# Patient Record
Sex: Male | Born: 1985 | Race: White | Hispanic: No | Marital: Married | Smoking: Former smoker
Health system: Southern US, Community
[De-identification: ages and names within clinical notes are randomized; demographics above are authoritative.]

## PROBLEM LIST (undated history)

## (undated) DIAGNOSIS — M109 Gout, unspecified: Secondary | ICD-10-CM

## (undated) DIAGNOSIS — I10 Essential (primary) hypertension: Secondary | ICD-10-CM

## (undated) DIAGNOSIS — E559 Vitamin D deficiency, unspecified: Secondary | ICD-10-CM

## (undated) DIAGNOSIS — E785 Hyperlipidemia, unspecified: Secondary | ICD-10-CM

## (undated) DIAGNOSIS — E291 Testicular hypofunction: Secondary | ICD-10-CM

## (undated) DIAGNOSIS — R7303 Prediabetes: Secondary | ICD-10-CM

## (undated) DIAGNOSIS — M25512 Pain in left shoulder: Secondary | ICD-10-CM

## (undated) DIAGNOSIS — G4733 Obstructive sleep apnea (adult) (pediatric): Secondary | ICD-10-CM

## (undated) HISTORY — DX: Pain in left shoulder: M25.512

## (undated) HISTORY — DX: Prediabetes: R73.03

## (undated) HISTORY — DX: Gout, unspecified: M10.9

## (undated) HISTORY — PX: FRACTURE SURGERY: SHX138

## (undated) HISTORY — DX: Essential (primary) hypertension: I10

## (undated) HISTORY — DX: Hyperlipidemia, unspecified: E78.5

## (undated) HISTORY — DX: Vitamin D deficiency, unspecified: E55.9

## (undated) HISTORY — DX: Obstructive sleep apnea (adult) (pediatric): G47.33

## (undated) HISTORY — PX: WISDOM TOOTH EXTRACTION: SHX21

## (undated) HISTORY — PX: FEMUR FRACTURE SURGERY: SHX633

## (undated) HISTORY — DX: Testicular hypofunction: E29.1

---
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
## 2009-03-16 HISTORY — PX: FOOT SURGERY: SHX648

## 2009-10-22 ENCOUNTER — Emergency Department (HOSPITAL_COMMUNITY): Admission: EM | Admit: 2009-10-22 | Discharge: 2009-10-22 | Payer: Self-pay | Admitting: Emergency Medicine

## 2009-11-01 ENCOUNTER — Emergency Department (HOSPITAL_COMMUNITY): Admission: EM | Admit: 2009-11-01 | Discharge: 2009-11-01 | Payer: Self-pay | Admitting: Emergency Medicine

## 2010-02-07 ENCOUNTER — Inpatient Hospital Stay (HOSPITAL_COMMUNITY)
Admission: EM | Admit: 2010-02-07 | Discharge: 2010-02-15 | Disposition: A | Discharge status: Rehabilitation Facility | Payer: Self-pay | Source: Home / Self Care | Admitting: Emergency Medicine

## 2010-02-15 ENCOUNTER — Inpatient Hospital Stay (HOSPITAL_COMMUNITY)
Admission: RE | Admit: 2010-02-15 | Discharge: 2010-02-22 | Payer: Self-pay | Attending: Physical Medicine & Rehabilitation | Admitting: Physical Medicine & Rehabilitation

## 2010-02-15 ENCOUNTER — Inpatient Hospital Stay: Admission: EM | Admit: 2010-02-15 | Payer: Self-pay | Admitting: Physical Medicine & Rehabilitation

## 2010-03-25 ENCOUNTER — Encounter (HOSPITAL_COMMUNITY)
Admission: RE | Admit: 2010-03-25 | Discharge: 2010-04-15 | Payer: Self-pay | Source: Home / Self Care | Attending: Orthopedic Surgery | Admitting: Orthopedic Surgery

## 2010-04-18 ENCOUNTER — Ambulatory Visit (HOSPITAL_COMMUNITY): Payer: Self-pay

## 2010-04-23 ENCOUNTER — Ambulatory Visit (HOSPITAL_COMMUNITY)
Admission: RE | Admit: 2010-04-23 | Discharge: 2010-04-23 | Disposition: A | Discharge status: Home / Self Care | Payer: No Typology Code available for payment source | Source: Ambulatory Visit | Attending: Orthopedic Surgery | Admitting: Orthopedic Surgery

## 2010-04-23 DIAGNOSIS — R269 Unspecified abnormalities of gait and mobility: Secondary | ICD-10-CM | POA: Insufficient documentation

## 2010-04-23 DIAGNOSIS — M25559 Pain in unspecified hip: Secondary | ICD-10-CM | POA: Insufficient documentation

## 2010-04-23 DIAGNOSIS — R262 Difficulty in walking, not elsewhere classified: Secondary | ICD-10-CM | POA: Insufficient documentation

## 2010-04-23 DIAGNOSIS — M25659 Stiffness of unspecified hip, not elsewhere classified: Secondary | ICD-10-CM | POA: Insufficient documentation

## 2010-04-23 DIAGNOSIS — IMO0001 Reserved for inherently not codable concepts without codable children: Secondary | ICD-10-CM | POA: Insufficient documentation

## 2010-04-23 DIAGNOSIS — M79609 Pain in unspecified limb: Secondary | ICD-10-CM | POA: Insufficient documentation

## 2010-04-25 ENCOUNTER — Ambulatory Visit (HOSPITAL_COMMUNITY): Payer: Self-pay

## 2010-04-30 ENCOUNTER — Ambulatory Visit (HOSPITAL_COMMUNITY)
Admission: RE | Admit: 2010-04-30 | Discharge: 2010-04-30 | Disposition: A | Discharge status: Home / Self Care | Payer: No Typology Code available for payment source | Source: Ambulatory Visit | Attending: Orthopedic Surgery | Admitting: Orthopedic Surgery

## 2010-04-30 DIAGNOSIS — M25559 Pain in unspecified hip: Secondary | ICD-10-CM | POA: Insufficient documentation

## 2010-04-30 DIAGNOSIS — R262 Difficulty in walking, not elsewhere classified: Secondary | ICD-10-CM | POA: Insufficient documentation

## 2010-04-30 DIAGNOSIS — M25673 Stiffness of unspecified ankle, not elsewhere classified: Secondary | ICD-10-CM | POA: Insufficient documentation

## 2010-04-30 DIAGNOSIS — IMO0001 Reserved for inherently not codable concepts without codable children: Secondary | ICD-10-CM | POA: Insufficient documentation

## 2010-04-30 DIAGNOSIS — M25676 Stiffness of unspecified foot, not elsewhere classified: Secondary | ICD-10-CM | POA: Insufficient documentation

## 2010-04-30 DIAGNOSIS — M6281 Muscle weakness (generalized): Secondary | ICD-10-CM | POA: Insufficient documentation

## 2010-04-30 DIAGNOSIS — M25659 Stiffness of unspecified hip, not elsewhere classified: Secondary | ICD-10-CM | POA: Insufficient documentation

## 2010-05-02 ENCOUNTER — Ambulatory Visit (HOSPITAL_COMMUNITY)
Admission: RE | Admit: 2010-05-02 | Discharge: 2010-05-02 | Disposition: A | Discharge status: Home / Self Care | Payer: No Typology Code available for payment source | Source: Ambulatory Visit

## 2010-05-07 ENCOUNTER — Ambulatory Visit (HOSPITAL_COMMUNITY)
Admission: RE | Admit: 2010-05-07 | Discharge: 2010-05-07 | Disposition: A | Discharge status: Home / Self Care | Payer: No Typology Code available for payment source | Source: Ambulatory Visit

## 2010-05-09 ENCOUNTER — Ambulatory Visit (HOSPITAL_COMMUNITY): Payer: No Typology Code available for payment source

## 2010-05-13 ENCOUNTER — Ambulatory Visit (HOSPITAL_COMMUNITY)
Admission: RE | Admit: 2010-05-13 | Discharge: 2010-05-13 | Disposition: A | Discharge status: Home / Self Care | Payer: No Typology Code available for payment source | Source: Ambulatory Visit | Attending: *Deleted | Admitting: *Deleted

## 2010-05-15 ENCOUNTER — Ambulatory Visit (HOSPITAL_COMMUNITY)
Admission: RE | Admit: 2010-05-15 | Discharge: 2010-05-15 | Disposition: A | Discharge status: Home / Self Care | Payer: No Typology Code available for payment source | Source: Ambulatory Visit | Attending: Orthopedic Surgery | Admitting: Orthopedic Surgery

## 2010-05-15 DIAGNOSIS — M25659 Stiffness of unspecified hip, not elsewhere classified: Secondary | ICD-10-CM | POA: Insufficient documentation

## 2010-05-15 DIAGNOSIS — M25673 Stiffness of unspecified ankle, not elsewhere classified: Secondary | ICD-10-CM | POA: Insufficient documentation

## 2010-05-15 DIAGNOSIS — R262 Difficulty in walking, not elsewhere classified: Secondary | ICD-10-CM | POA: Insufficient documentation

## 2010-05-15 DIAGNOSIS — M25559 Pain in unspecified hip: Secondary | ICD-10-CM | POA: Insufficient documentation

## 2010-05-15 DIAGNOSIS — M25676 Stiffness of unspecified foot, not elsewhere classified: Secondary | ICD-10-CM | POA: Insufficient documentation

## 2010-05-15 DIAGNOSIS — IMO0001 Reserved for inherently not codable concepts without codable children: Secondary | ICD-10-CM | POA: Insufficient documentation

## 2010-05-15 DIAGNOSIS — M6281 Muscle weakness (generalized): Secondary | ICD-10-CM | POA: Insufficient documentation

## 2010-05-20 ENCOUNTER — Ambulatory Visit (HOSPITAL_COMMUNITY)
Admission: RE | Admit: 2010-05-20 | Discharge: 2010-05-20 | Disposition: A | Discharge status: Home / Self Care | Payer: No Typology Code available for payment source | Source: Ambulatory Visit

## 2010-05-22 ENCOUNTER — Ambulatory Visit (HOSPITAL_COMMUNITY)
Admission: RE | Admit: 2010-05-22 | Discharge: 2010-05-22 | Disposition: A | Discharge status: Home / Self Care | Payer: No Typology Code available for payment source | Source: Ambulatory Visit

## 2010-05-27 ENCOUNTER — Ambulatory Visit (HOSPITAL_COMMUNITY)
Admission: RE | Admit: 2010-05-27 | Discharge: 2010-05-27 | Disposition: A | Discharge status: Home / Self Care | Payer: No Typology Code available for payment source | Source: Ambulatory Visit

## 2010-05-27 LAB — BASIC METABOLIC PANEL
BUN: 10 mg/dL (ref 6–23)
BUN: 6 mg/dL (ref 6–23)
BUN: 8 mg/dL (ref 6–23)
CO2: 28 mEq/L (ref 19–32)
CO2: 31 mEq/L (ref 19–32)
Chloride: 100 mEq/L (ref 96–112)
Chloride: 102 mEq/L (ref 96–112)
Chloride: 95 mEq/L — ABNORMAL LOW (ref 96–112)
Chloride: 95 mEq/L — ABNORMAL LOW (ref 96–112)
Chloride: 96 mEq/L (ref 96–112)
Chloride: 98 mEq/L (ref 96–112)
Creatinine, Ser: 0.85 mg/dL (ref 0.4–1.5)
Creatinine, Ser: 0.98 mg/dL (ref 0.4–1.5)
GFR calc Af Amer: >60 mL/min (ref >60)
GFR calc Af Amer: >60 mL/min (ref >60)
GFR calc Af Amer: >60 mL/min (ref >60)
GFR calc non Af Amer: >60 mL/min (ref >60)
GFR calc non Af Amer: >60 mL/min (ref >60)
GFR calc non Af Amer: >60 mL/min (ref >60)
Glucose, Bld: 101 mg/dL — ABNORMAL HIGH (ref 70–99)
Glucose, Bld: 89 mg/dL (ref 70–99)
Glucose, Bld: 89 mg/dL (ref 70–99)
Potassium: 3.7 mEq/L (ref 3.5–5.1)
Potassium: 3.9 mEq/L (ref 3.5–5.1)
Potassium: 4 mEq/L (ref 3.5–5.1)
Potassium: 4.1 mEq/L (ref 3.5–5.1)
Potassium: 4.1 mEq/L (ref 3.5–5.1)
Potassium: 4.2 mEq/L (ref 3.5–5.1)
Sodium: 130 mEq/L — ABNORMAL LOW (ref 135–145)
Sodium: 133 mEq/L — ABNORMAL LOW (ref 135–145)

## 2010-05-27 LAB — CBC
HCT: 24.9 % — ABNORMAL LOW (ref 39.0–52.0)
HCT: 25.2 % — ABNORMAL LOW (ref 39.0–52.0)
HCT: 27.1 % — ABNORMAL LOW (ref 39.0–52.0)
HCT: 29.7 % — ABNORMAL LOW (ref 39.0–52.0)
HCT: 32.5 % — ABNORMAL LOW (ref 39.0–52.0)
HCT: 35.4 % — ABNORMAL LOW (ref 39.0–52.0)
HCT: 42.5 % (ref 39.0–52.0)
Hemoglobin: 10.5 g/dL — ABNORMAL LOW (ref 13.0–17.0)
Hemoglobin: 11.5 g/dL — ABNORMAL LOW (ref 13.0–17.0)
Hemoglobin: 15.3 g/dL (ref 13.0–17.0)
Hemoglobin: 8.8 g/dL — ABNORMAL LOW (ref 13.0–17.0)
MCH: 30.3 pg (ref 26.0–34.0)
MCHC: 34.7 g/dL (ref 30.0–36.0)
MCHC: 35.1 g/dL (ref 30.0–36.0)
MCHC: 36 g/dL (ref 30.0–36.0)
MCV: 84.3 fL (ref 78.0–100.0)
MCV: 85 fL (ref 78.0–100.0)
MCV: 85.6 fL (ref 78.0–100.0)
MCV: 85.8 fL (ref 78.0–100.0)
MCV: 86.2 fL (ref 78.0–100.0)
MCV: 87 fL (ref 78.0–100.0)
Platelets: 215 10*3/uL (ref 150–400)
Platelets: 216 10*3/uL (ref 150–400)
Platelets: 227 10*3/uL (ref 150–400)
Platelets: 259 10*3/uL (ref 150–400)
Platelets: 558 10*3/uL — ABNORMAL HIGH (ref 150–400)
RBC: 2.93 MIL/uL — ABNORMAL LOW (ref 4.22–5.81)
RBC: 3.47 MIL/uL — ABNORMAL LOW (ref 4.22–5.81)
RBC: 4.07 MIL/uL — ABNORMAL LOW (ref 4.22–5.81)
RDW: 12 % (ref 11.5–15.5)
RDW: 12.1 % (ref 11.5–15.5)
RDW: 12.3 % (ref 11.5–15.5)
RDW: 12.3 % (ref 11.5–15.5)
WBC: 10.6 10*3/uL — ABNORMAL HIGH (ref 4.0–10.5)
WBC: 8.9 10*3/uL (ref 4.0–10.5)
WBC: 9.1 10*3/uL (ref 4.0–10.5)
WBC: 9.2 10*3/uL (ref 4.0–10.5)
WBC: 9.6 10*3/uL (ref 4.0–10.5)

## 2010-05-27 LAB — COMPREHENSIVE METABOLIC PANEL
ALT: 116 U/L — ABNORMAL HIGH (ref 0–53)
ALT: 80 U/L — ABNORMAL HIGH (ref 0–53)
AST: 76 U/L — ABNORMAL HIGH (ref 0–37)
Albumin: 2.4 g/dL — ABNORMAL LOW (ref 3.5–5.2)
Alkaline Phosphatase: 70 U/L (ref 39–117)
CO2: 27 mEq/L (ref 19–32)
Calcium: 8.5 mg/dL (ref 8.4–10.5)
Calcium: 8.7 mg/dL (ref 8.4–10.5)
Chloride: 106 mEq/L (ref 96–112)
GFR calc Af Amer: >60 mL/min (ref >60)
GFR calc non Af Amer: >60 mL/min (ref >60)
Glucose, Bld: 121 mg/dL — ABNORMAL HIGH (ref 70–99)
Potassium: 3.8 mEq/L (ref 3.5–5.1)
Sodium: 132 mEq/L — ABNORMAL LOW (ref 135–145)
Sodium: 141 mEq/L (ref 135–145)
Total Bilirubin: 0.5 mg/dL (ref 0.3–1.2)
Total Protein: 6.3 g/dL (ref 6.0–8.3)

## 2010-05-27 LAB — PROTIME-INR
INR: 1.22 (ref 0.00–1.49)
INR: 1.73 — ABNORMAL HIGH (ref 0.00–1.49)
INR: 2.24 — ABNORMAL HIGH (ref 0.00–1.49)
INR: 2.24 — ABNORMAL HIGH (ref 0.00–1.49)
INR: 2.84 — ABNORMAL HIGH (ref 0.00–1.49)
INR: 3.23 — ABNORMAL HIGH (ref 0.00–1.49)
Prothrombin Time: 13.6 seconds (ref 11.6–15.2)
Prothrombin Time: 20.5 seconds — ABNORMAL HIGH (ref 11.6–15.2)
Prothrombin Time: 24.9 seconds — ABNORMAL HIGH (ref 11.6–15.2)
Prothrombin Time: 27.9 seconds — ABNORMAL HIGH (ref 11.6–15.2)

## 2010-05-27 LAB — DIFFERENTIAL
Basophils Relative: 1 % (ref 0–1)
Eosinophils Relative: 4 % (ref 0–5)
Lymphs Abs: 2.3 10*3/uL (ref 0.7–4.0)
Monocytes Absolute: 1 10*3/uL (ref 0.1–1.0)
Neutrophils Relative %: 58 % (ref 43–77)

## 2010-05-27 LAB — ABO/RH: ABO/RH(D): O POS

## 2010-05-27 LAB — POCT I-STAT 4, (NA,K, GLUC, HGB,HCT): Hemoglobin: 11.6 g/dL — ABNORMAL LOW (ref 13.0–17.0)

## 2010-05-28 ENCOUNTER — Ambulatory Visit (HOSPITAL_COMMUNITY)
Admission: RE | Admit: 2010-05-28 | Discharge: 2010-05-28 | Disposition: A | Discharge status: Home / Self Care | Payer: No Typology Code available for payment source | Source: Ambulatory Visit

## 2010-06-04 ENCOUNTER — Ambulatory Visit (HOSPITAL_COMMUNITY)
Admission: RE | Admit: 2010-06-04 | Discharge: 2010-06-04 | Disposition: A | Discharge status: Home / Self Care | Payer: No Typology Code available for payment source | Source: Ambulatory Visit

## 2010-06-06 ENCOUNTER — Ambulatory Visit (HOSPITAL_COMMUNITY): Payer: No Typology Code available for payment source

## 2010-06-11 ENCOUNTER — Ambulatory Visit (HOSPITAL_COMMUNITY): Payer: No Typology Code available for payment source

## 2010-06-12 ENCOUNTER — Ambulatory Visit (HOSPITAL_COMMUNITY): Payer: No Typology Code available for payment source

## 2010-06-18 ENCOUNTER — Ambulatory Visit (HOSPITAL_COMMUNITY)
Admission: RE | Admit: 2010-06-18 | Discharge: 2010-06-18 | Disposition: A | Discharge status: Home / Self Care | Payer: No Typology Code available for payment source | Source: Ambulatory Visit | Attending: Orthopedic Surgery | Admitting: Orthopedic Surgery

## 2010-06-18 DIAGNOSIS — M25659 Stiffness of unspecified hip, not elsewhere classified: Secondary | ICD-10-CM | POA: Insufficient documentation

## 2010-06-18 DIAGNOSIS — R262 Difficulty in walking, not elsewhere classified: Secondary | ICD-10-CM | POA: Insufficient documentation

## 2010-06-18 DIAGNOSIS — IMO0001 Reserved for inherently not codable concepts without codable children: Secondary | ICD-10-CM | POA: Insufficient documentation

## 2010-06-18 DIAGNOSIS — M6281 Muscle weakness (generalized): Secondary | ICD-10-CM | POA: Insufficient documentation

## 2010-06-18 DIAGNOSIS — M25559 Pain in unspecified hip: Secondary | ICD-10-CM | POA: Insufficient documentation

## 2010-06-18 DIAGNOSIS — M25676 Stiffness of unspecified foot, not elsewhere classified: Secondary | ICD-10-CM | POA: Insufficient documentation

## 2010-06-18 DIAGNOSIS — M25673 Stiffness of unspecified ankle, not elsewhere classified: Secondary | ICD-10-CM | POA: Insufficient documentation

## 2010-06-19 ENCOUNTER — Ambulatory Visit (HOSPITAL_COMMUNITY)
Admission: RE | Admit: 2010-06-19 | Discharge: 2010-06-19 | Disposition: A | Discharge status: Home / Self Care | Payer: No Typology Code available for payment source | Source: Ambulatory Visit

## 2010-06-24 ENCOUNTER — Ambulatory Visit (HOSPITAL_COMMUNITY): Payer: No Typology Code available for payment source

## 2010-06-26 ENCOUNTER — Ambulatory Visit (HOSPITAL_COMMUNITY): Payer: No Typology Code available for payment source

## 2010-07-02 ENCOUNTER — Ambulatory Visit (HOSPITAL_COMMUNITY): Payer: No Typology Code available for payment source

## 2010-07-03 ENCOUNTER — Ambulatory Visit (HOSPITAL_COMMUNITY): Payer: No Typology Code available for payment source

## 2012-07-06 ENCOUNTER — Observation Stay (HOSPITAL_COMMUNITY)
Admission: EM | Admit: 2012-07-06 | Discharge: 2012-07-07 | Disposition: A | Discharge status: Home / Self Care | Payer: Self-pay | Attending: Orthopedic Surgery | Admitting: Orthopedic Surgery

## 2012-07-06 ENCOUNTER — Encounter (HOSPITAL_COMMUNITY): Payer: Self-pay | Admitting: Anesthesiology

## 2012-07-06 ENCOUNTER — Inpatient Hospital Stay (HOSPITAL_COMMUNITY): Payer: Self-pay | Admitting: Anesthesiology

## 2012-07-06 ENCOUNTER — Encounter (HOSPITAL_COMMUNITY)
Admission: EM | Disposition: A | Discharge status: Home / Self Care | Payer: Self-pay | Source: Home / Self Care | Attending: Orthopedic Surgery

## 2012-07-06 ENCOUNTER — Emergency Department (HOSPITAL_COMMUNITY): Payer: Self-pay

## 2012-07-06 ENCOUNTER — Encounter (HOSPITAL_COMMUNITY): Payer: Self-pay | Admitting: Emergency Medicine

## 2012-07-06 DIAGNOSIS — S7010XA Contusion of unspecified thigh, initial encounter: Principal | ICD-10-CM | POA: Insufficient documentation

## 2012-07-06 DIAGNOSIS — M25462 Effusion, left knee: Secondary | ICD-10-CM

## 2012-07-06 DIAGNOSIS — M25469 Effusion, unspecified knee: Secondary | ICD-10-CM

## 2012-07-06 DIAGNOSIS — S7012XA Contusion of left thigh, initial encounter: Secondary | ICD-10-CM

## 2012-07-06 DIAGNOSIS — X58XXXA Exposure to other specified factors, initial encounter: Secondary | ICD-10-CM | POA: Insufficient documentation

## 2012-07-06 DIAGNOSIS — M25562 Pain in left knee: Secondary | ICD-10-CM

## 2012-07-06 HISTORY — PX: HEMATOMA EVACUATION: SHX5118

## 2012-07-06 LAB — PROTIME-INR: Prothrombin Time: 13.4 seconds (ref 11.6–15.2)

## 2012-07-06 LAB — CBC
HCT: 36.6 % — ABNORMAL LOW (ref 39.0–52.0)
MCH: 30.3 pg (ref 26.0–34.0)
MCV: 84.7 fL (ref 78.0–100.0)
Platelets: 333 10*3/uL (ref 150–400)
RDW: 13.1 % (ref 11.5–15.5)

## 2012-07-06 LAB — BASIC METABOLIC PANEL
BUN: 14 mg/dL (ref 6–23)
Calcium: 9.1 mg/dL (ref 8.4–10.5)
Creatinine, Ser: 0.78 mg/dL (ref 0.50–1.35)
GFR calc Af Amer: >90 mL/min (ref >90)

## 2012-07-06 LAB — CBC WITH DIFFERENTIAL/PLATELET
Basophils Relative: 0 % (ref 0–1)
Lymphs Abs: 1.3 10*3/uL (ref 0.7–4.0)
Monocytes Absolute: 0.6 10*3/uL (ref 0.1–1.0)
Monocytes Relative: 4 % (ref 3–12)
Neutro Abs: 14.1 10*3/uL — ABNORMAL HIGH (ref 1.7–7.7)
Neutrophils Relative %: 88 % — ABNORMAL HIGH (ref 43–77)

## 2012-07-06 LAB — TYPE AND SCREEN

## 2012-07-06 SURGERY — EVACUATION HEMATOMA
Anesthesia: General | Site: Knee | Laterality: Left | Wound class: Dirty or Infected

## 2012-07-06 MED ORDER — METOCLOPRAMIDE HCL 10 MG PO TABS: 5.0000 mg | ORAL_TABLET | Freq: Three times a day (TID) | ORAL | Status: DC | PRN

## 2012-07-06 MED ORDER — MIDAZOLAM HCL 2 MG/2ML IJ SOLN
1.0000 mg | INTRAMUSCULAR | Status: DC | PRN
  Administered 2012-07-06: 2 mg via INTRAVENOUS

## 2012-07-06 MED ORDER — CEFAZOLIN SODIUM-DEXTROSE 2-3 GM-% IV SOLR
2.0000 g | Freq: Four times a day (QID) | INTRAVENOUS | Status: AC
  Administered 2012-07-06 (×2): 2 g via INTRAVENOUS
  Filled 2012-07-06 (×2): qty 50

## 2012-07-06 MED ORDER — OXYCODONE HCL 5 MG PO TABS
5.0000 mg | ORAL_TABLET | ORAL | Status: DC
  Administered 2012-07-06: 5 mg via ORAL
  Filled 2012-07-06: qty 1

## 2012-07-06 MED ORDER — SUCCINYLCHOLINE CHLORIDE 20 MG/ML IJ SOLN
INTRAMUSCULAR | Status: DC | PRN
  Administered 2012-07-06: 120 mg via INTRAVENOUS

## 2012-07-06 MED ORDER — METOCLOPRAMIDE HCL 5 MG/ML IJ SOLN: 5.0000 mg | Freq: Three times a day (TID) | INTRAMUSCULAR | Status: DC | PRN

## 2012-07-06 MED ORDER — ROCURONIUM BROMIDE 50 MG/5ML IV SOLN
INTRAVENOUS | Status: AC
  Filled 2012-07-06: qty 1

## 2012-07-06 MED ORDER — CEFAZOLIN SODIUM 1-5 GM-% IV SOLN: 1.0000 g | INTRAVENOUS | Status: DC

## 2012-07-06 MED ORDER — FENTANYL CITRATE 0.05 MG/ML IJ SOLN
INTRAMUSCULAR | Status: AC
  Filled 2012-07-06: qty 2

## 2012-07-06 MED ORDER — MIDAZOLAM HCL 2 MG/2ML IJ SOLN
INTRAMUSCULAR | Status: AC
  Filled 2012-07-06: qty 2

## 2012-07-06 MED ORDER — SODIUM CHLORIDE 0.9 % IR SOLN
Status: DC | PRN
  Administered 2012-07-06 (×2): 1000 mL

## 2012-07-06 MED ORDER — KETOROLAC TROMETHAMINE 30 MG/ML IJ SOLN
30.0000 mg | Freq: Once | INTRAMUSCULAR | Status: AC
  Administered 2012-07-06: 30 mg via INTRAVENOUS

## 2012-07-06 MED ORDER — OXYCODONE HCL 5 MG PO TABS
5.0000 mg | ORAL_TABLET | Freq: Once | ORAL | Status: AC
Start: 2012-07-06 — End: 2012-07-06
  Administered 2012-07-06: 5 mg via ORAL

## 2012-07-06 MED ORDER — NEOSTIGMINE METHYLSULFATE 1 MG/ML IJ SOLN
INTRAMUSCULAR | Status: DC | PRN
  Administered 2012-07-06: 3 mg via INTRAVENOUS

## 2012-07-06 MED ORDER — LIDOCAINE HCL (PF) 1 % IJ SOLN
INTRAMUSCULAR | Status: AC
  Filled 2012-07-06: qty 5

## 2012-07-06 MED ORDER — GLYCOPYRROLATE 0.2 MG/ML IJ SOLN
INTRAMUSCULAR | Status: DC | PRN
  Administered 2012-07-06: .6 mg via INTRAVENOUS

## 2012-07-06 MED ORDER — FENTANYL CITRATE 0.05 MG/ML IJ SOLN
25.0000 ug | INTRAMUSCULAR | Status: DC | PRN
  Administered 2012-07-06: 25 ug via INTRAVENOUS

## 2012-07-06 MED ORDER — GLYCOPYRROLATE 0.2 MG/ML IJ SOLN
INTRAMUSCULAR | Status: AC
  Filled 2012-07-06: qty 3

## 2012-07-06 MED ORDER — ONDANSETRON HCL 4 MG/2ML IJ SOLN: 4.0000 mg | Freq: Once | INTRAMUSCULAR | Status: DC | PRN

## 2012-07-06 MED ORDER — HYDROMORPHONE HCL PF 1 MG/ML IJ SOLN
1.0000 mg | Freq: Once | INTRAMUSCULAR | Status: AC
  Administered 2012-07-06: 1 mg via INTRAVENOUS
  Filled 2012-07-06: qty 1

## 2012-07-06 MED ORDER — OXYCODONE HCL 5 MG PO TABS: 5.0000 mg | ORAL_TABLET | ORAL | Status: DC | PRN

## 2012-07-06 MED ORDER — FENTANYL CITRATE 0.05 MG/ML IJ SOLN
25.0000 ug | INTRAMUSCULAR | Status: DC | PRN
  Administered 2012-07-06: 50 ug via INTRAVENOUS

## 2012-07-06 MED ORDER — LIDOCAINE HCL (CARDIAC) 20 MG/ML IV SOLN: INTRAVENOUS | Status: DC | PRN

## 2012-07-06 MED ORDER — POLYETHYLENE GLYCOL 3350 17 G PO PACK
17.0000 g | PACK | Freq: Every day | ORAL | Status: DC
  Administered 2012-07-06 – 2012-07-07 (×2): 17 g via ORAL
  Filled 2012-07-06 (×2): qty 1

## 2012-07-06 MED ORDER — PROPOFOL 10 MG/ML IV BOLUS
INTRAVENOUS | Status: DC | PRN
  Administered 2012-07-06: 200 mg via INTRAVENOUS

## 2012-07-06 MED ORDER — LIDOCAINE-EPINEPHRINE (PF) 1 %-1:200000 IJ SOLN
INTRAMUSCULAR | Status: AC
  Filled 2012-07-06: qty 10

## 2012-07-06 MED ORDER — OXYCODONE-ACETAMINOPHEN 5-325 MG PO TABS: 1.0000 | ORAL_TABLET | Freq: Four times a day (QID) | ORAL | Status: DC | PRN

## 2012-07-06 MED ORDER — FENTANYL CITRATE 0.05 MG/ML IJ SOLN
INTRAMUSCULAR | Status: DC | PRN
  Administered 2012-07-06 (×5): 50 ug via INTRAVENOUS

## 2012-07-06 MED ORDER — HYDROMORPHONE HCL PF 1 MG/ML IJ SOLN
0.5000 mg | INTRAMUSCULAR | Status: DC | PRN
  Administered 2012-07-06: 1 mg via INTRAVENOUS
  Filled 2012-07-06: qty 1

## 2012-07-06 MED ORDER — ONDANSETRON HCL 4 MG/2ML IJ SOLN
4.0000 mg | Freq: Four times a day (QID) | INTRAMUSCULAR | Status: DC | PRN
  Administered 2012-07-06: 4 mg via INTRAVENOUS
  Filled 2012-07-06: qty 2

## 2012-07-06 MED ORDER — SODIUM CHLORIDE 0.9 % IV SOLN
INTRAVENOUS | Status: DC
  Administered 2012-07-06 – 2012-07-07 (×2): via INTRAVENOUS

## 2012-07-06 MED ORDER — PROPOFOL 10 MG/ML IV EMUL
INTRAVENOUS | Status: AC
  Filled 2012-07-06: qty 20

## 2012-07-06 MED ORDER — LIDOCAINE HCL (CARDIAC) 20 MG/ML IV SOLN
INTRAVENOUS | Status: DC | PRN
  Administered 2012-07-06: 20 mg via INTRAVENOUS

## 2012-07-06 MED ORDER — ONDANSETRON HCL 4 MG/2ML IJ SOLN
4.0000 mg | Freq: Once | INTRAMUSCULAR | Status: AC
  Administered 2012-07-06: 4 mg via INTRAVENOUS
  Filled 2012-07-06: qty 2

## 2012-07-06 MED ORDER — PROMETHAZINE HCL 12.5 MG PO TABS: 25.0000 mg | ORAL_TABLET | Freq: Four times a day (QID) | ORAL | Status: DC | PRN

## 2012-07-06 MED ORDER — FENTANYL CITRATE 0.05 MG/ML IJ SOLN
INTRAMUSCULAR | Status: AC
  Filled 2012-07-06: qty 5

## 2012-07-06 MED ORDER — HYDROCODONE-ACETAMINOPHEN 10-325 MG PO TABS
1.0000 | ORAL_TABLET | ORAL | Status: DC
  Administered 2012-07-06 – 2012-07-07 (×3): 1 via ORAL
  Filled 2012-07-06 (×4): qty 1

## 2012-07-06 MED ORDER — MORPHINE SULFATE 4 MG/ML IJ SOLN: 4.0000 mg | INTRAMUSCULAR | Status: DC | PRN

## 2012-07-06 MED ORDER — ACETAMINOPHEN 650 MG RE SUPP: 650.0000 mg | Freq: Three times a day (TID) | RECTAL | Status: DC | PRN

## 2012-07-06 MED ORDER — PHENOL 1.4 % MT LIQD: 1.0000 | OROMUCOSAL | Status: DC | PRN

## 2012-07-06 MED ORDER — SODIUM CHLORIDE 0.9 % IV SOLN
INTRAVENOUS | Status: DC
  Administered 2012-07-06: 13:00:00 via INTRAVENOUS

## 2012-07-06 MED ORDER — LIDOCAINE-EPINEPHRINE (PF) 1 %-1:200000 IJ SOLN: 10.0000 mL | Freq: Once | INTRAMUSCULAR | Status: DC

## 2012-07-06 MED ORDER — ONDANSETRON HCL 4 MG/2ML IJ SOLN
INTRAMUSCULAR | Status: AC
  Filled 2012-07-06: qty 2

## 2012-07-06 MED ORDER — DEXTROSE 5 % IV SOLN
3.0000 g | INTRAVENOUS | Status: DC
  Administered 2012-07-06: 3 g via INTRAVENOUS

## 2012-07-06 MED ORDER — CEFAZOLIN SODIUM 1-5 GM-% IV SOLN
INTRAVENOUS | Status: AC
  Filled 2012-07-06: qty 50

## 2012-07-06 MED ORDER — MORPHINE SULFATE 2 MG/ML IJ SOLN: 0.5000 mg | INTRAMUSCULAR | Status: DC | PRN

## 2012-07-06 MED ORDER — ONDANSETRON HCL 4 MG PO TABS: 4.0000 mg | ORAL_TABLET | Freq: Four times a day (QID) | ORAL | Status: DC | PRN

## 2012-07-06 MED ORDER — ACETAMINOPHEN 10 MG/ML IV SOLN
1000.0000 mg | Freq: Four times a day (QID) | INTRAVENOUS | Status: AC
  Administered 2012-07-06 – 2012-07-07 (×3): 1000 mg via INTRAVENOUS
  Filled 2012-07-06 (×3): qty 100

## 2012-07-06 MED ORDER — CEFAZOLIN SODIUM-DEXTROSE 2-3 GM-% IV SOLR: 2.0000 g | INTRAVENOUS | Status: DC

## 2012-07-06 MED ORDER — ACETAMINOPHEN 325 MG PO TABS: 650.0000 mg | ORAL_TABLET | Freq: Three times a day (TID) | ORAL | Status: DC | PRN

## 2012-07-06 MED ORDER — ONDANSETRON HCL 4 MG/2ML IJ SOLN
4.0000 mg | Freq: Once | INTRAMUSCULAR | Status: AC
  Administered 2012-07-06: 4 mg via INTRAVENOUS

## 2012-07-06 MED ORDER — KETOROLAC TROMETHAMINE 30 MG/ML IJ SOLN
INTRAMUSCULAR | Status: AC
  Filled 2012-07-06: qty 1

## 2012-07-06 MED ORDER — OXYCODONE HCL 5 MG PO TABS
ORAL_TABLET | ORAL | Status: AC
Start: 2012-07-06 — End: 2012-07-06
  Filled 2012-07-06: qty 1

## 2012-07-06 MED ORDER — SODIUM CHLORIDE 0.9 % IV SOLN
Freq: Once | INTRAVENOUS | Status: AC
  Administered 2012-07-06: 02:00:00 via INTRAVENOUS

## 2012-07-06 MED ORDER — ALUM & MAG HYDROXIDE-SIMETH 200-200-20 MG/5ML PO SUSP: 30.0000 mL | ORAL | Status: DC | PRN

## 2012-07-06 MED ORDER — ROCURONIUM BROMIDE 100 MG/10ML IV SOLN
INTRAVENOUS | Status: DC | PRN
  Administered 2012-07-06: 8 mg via INTRAVENOUS
  Administered 2012-07-06: 22 mg via INTRAVENOUS

## 2012-07-06 MED ORDER — NEOSTIGMINE METHYLSULFATE 1 MG/ML IJ SOLN
INTRAMUSCULAR | Status: AC
  Filled 2012-07-06: qty 1

## 2012-07-06 MED ORDER — IBUPROFEN 600 MG PO TABS: 600.0000 mg | ORAL_TABLET | Freq: Four times a day (QID) | ORAL | Status: DC | PRN

## 2012-07-06 MED ORDER — MENTHOL 3 MG MT LOZG: 1.0000 | LOZENGE | OROMUCOSAL | Status: DC | PRN

## 2012-07-06 MED ORDER — CEFAZOLIN SODIUM-DEXTROSE 2-3 GM-% IV SOLR
INTRAVENOUS | Status: AC
  Filled 2012-07-06: qty 50

## 2012-07-06 MED ORDER — ONDANSETRON HCL 4 MG/2ML IJ SOLN: 4.0000 mg | Freq: Four times a day (QID) | INTRAMUSCULAR | Status: DC | PRN

## 2012-07-06 MED ORDER — ACETAMINOPHEN 10 MG/ML IV SOLN
INTRAVENOUS | Status: AC
  Filled 2012-07-06: qty 100

## 2012-07-06 MED ORDER — LACTATED RINGERS IV SOLN
INTRAVENOUS | Status: DC
  Administered 2012-07-06: 15:00:00 via INTRAVENOUS

## 2012-07-06 SURGICAL SUPPLY — 48 items
BAG HAMPER (MISCELLANEOUS) ×2 IMPLANT
BANDAGE ELASTIC 4 VELCRO NS (GAUZE/BANDAGES/DRESSINGS) IMPLANT
BANDAGE ELASTIC 6 VELCRO NS (GAUZE/BANDAGES/DRESSINGS) IMPLANT
BNDG COHESIVE 4X5 TAN STRL (GAUZE/BANDAGES/DRESSINGS) ×2 IMPLANT
CHLORAPREP W/TINT 26ML (MISCELLANEOUS) IMPLANT
CLOTH BEACON ORANGE TIMEOUT ST (SAFETY) ×2 IMPLANT
COVER LIGHT HANDLE STERIS (MISCELLANEOUS) ×4 IMPLANT
CUFF TOURNIQUET SINGLE 34IN LL (TOURNIQUET CUFF) IMPLANT
ELECT REM PT RETURN 9FT ADLT (ELECTROSURGICAL) ×2
ELECTRODE REM PT RTRN 9FT ADLT (ELECTROSURGICAL) ×1 IMPLANT
EVACUATOR 3/16  PVC DRAIN (DRAIN) ×1
EVACUATOR 3/16 PVC DRAIN (DRAIN) ×1 IMPLANT
GAUZE XEROFORM 5X9 LF (GAUZE/BANDAGES/DRESSINGS) ×2 IMPLANT
GLOVE BIOGEL PI IND STRL 7.0 (GLOVE) ×2 IMPLANT
GLOVE BIOGEL PI IND STRL 7.5 (GLOVE) ×1 IMPLANT
GLOVE BIOGEL PI INDICATOR 7.0 (GLOVE) ×2
GLOVE BIOGEL PI INDICATOR 7.5 (GLOVE) ×1
GLOVE ECLIPSE 7.0 STRL STRAW (GLOVE) ×4 IMPLANT
GLOVE SKINSENSE NS SZ8.0 LF (GLOVE) ×2
GLOVE SKINSENSE STRL SZ8.0 LF (GLOVE) ×2 IMPLANT
GLOVE SS N UNI LF 8.5 STRL (GLOVE) ×2 IMPLANT
GOWN STRL REIN XL XLG (GOWN DISPOSABLE) ×4 IMPLANT
HANDPIECE INTERPULSE COAX TIP (DISPOSABLE)
IMMOBILIZER KNEE 19 UNV (ORTHOPEDIC SUPPLIES) ×2 IMPLANT
INST SET MINOR BONE (KITS) ×2 IMPLANT
KIT ROOM TURNOVER APOR (KITS) ×2 IMPLANT
MANIFOLD NEPTUNE II (INSTRUMENTS) ×2 IMPLANT
NS IRRIG 1000ML POUR BTL (IV SOLUTION) ×2 IMPLANT
PACK BASIC LIMB (CUSTOM PROCEDURE TRAY) ×2 IMPLANT
PAD ABD 5X9 TENDERSORB (GAUZE/BANDAGES/DRESSINGS) ×4 IMPLANT
PAD ARMBOARD 7.5X6 YLW CONV (MISCELLANEOUS) ×2 IMPLANT
PAD CAST 4YDX4 CTTN HI CHSV (CAST SUPPLIES) IMPLANT
PADDING CAST COTTON 4X4 STRL (CAST SUPPLIES)
PADDING CAST COTTON 6X4 STRL (CAST SUPPLIES) IMPLANT
SET BASIN LINEN APH (SET/KITS/TRAYS/PACK) ×2 IMPLANT
SET HNDPC FAN SPRY TIP SCT (DISPOSABLE) IMPLANT
SPONGE DRAIN TRACH 4X4 STRL 2S (GAUZE/BANDAGES/DRESSINGS) ×2 IMPLANT
SPONGE GAUZE 4X4 12PLY (GAUZE/BANDAGES/DRESSINGS) ×2 IMPLANT
SPONGE LAP 18X18 X RAY DECT (DISPOSABLE) ×6 IMPLANT
STAPLER VISISTAT 35W (STAPLE) ×2 IMPLANT
SUT ETHILON 3 0 FSL (SUTURE) ×2 IMPLANT
SUT MON AB 0 CT1 (SUTURE) ×4 IMPLANT
SUT MON AB 2-0 SH 27 (SUTURE) ×1
SUT MON AB 2-0 SH27 (SUTURE) ×1 IMPLANT
SUT MON AB 3-0 SH 27 (SUTURE) IMPLANT
SWAB CULTURE LIQ STUART DBL (MISCELLANEOUS) IMPLANT
SYR BULB IRRIGATION 50ML (SYRINGE) ×2 IMPLANT
YANKAUER SUCT 12FT TUBE ARGYLE (SUCTIONS) ×2 IMPLANT

## 2012-07-06 NOTE — Progress Notes (Signed)
Patient ID: Brandon Day, male   DOB: September 03, 1985, 27 y.o.   MRN: [id number]   This patient is heading for surgery today no DVT prophylaxis until postop
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
## 2012-07-06 NOTE — Brief Op Note (Addendum)
07/06/2012  2:29 PM  PATIENT:  Brandon Day  27 y.o. male  PRE-OPERATIVE DIAGNOSIS:  hematoma left thigh  POST-OPERATIVE DIAGNOSIS:  hematoma left thigh  PROCEDURE:  Procedure(s) with comments: EVACUATION HEMATOMA (Left) - left thigh   FINDINGS:  EXTREMELY LARGE HEMATOMA PUNCTURE WOUND  GRADE 4 WOUND   SURGEON:  Surgeon(s) and Role:    * Vickki Hearing, MD - Primary  PHYSICIAN ASSISTANT:   ASSISTANTS: none   ANESTHESIA:   general  EBL:  Total I/O In: 900 [I.V.:900] Out: 0   BLOOD ADMINISTERED:none  DRAINS: hemovac    LOCAL MEDICATIONS USED:  NONE  SPECIMEN:  No Specimen  DISPOSITION OF SPECIMEN:  N/A  COUNTS:  YES  TOURNIQUET:    DICTATION: .Dragon Dictation  PLAN OF CARE: Admit to inpatient   PATIENT DISPOSITION:  PACU - hemodynamically stable.   Delay start of Pharmacological VTE agent (>24hrs) due to surgical blood loss or risk of bleeding: yes

## 2012-07-06 NOTE — Anesthesia Postprocedure Evaluation (Addendum)
  Anesthesia Post-op Note  Patient: Brandon Day  Procedure(s) Performed: Procedure(s) with comments: EVACUATION HEMATOMA (Left) - left thigh   Patient Location: PACU  Anesthesia Type:General  Level of Consciousness: awake, alert  and oriented  Airway and Oxygen Therapy: Patient Spontanous Breathing and Patient connected to face mask oxygen  Post-op Pain: mild  Post-op Assessment: Post-op Vital signs reviewed, Patient's Cardiovascular Status Stable, Respiratory Function Stable, Patent Airway and No signs of Nausea or vomiting  Post-op Vital Signs: Reviewed and stable  Complications: No apparent anesthesia complications 07/07/12  VSS, No apparent anesthesia complications.

## 2012-07-06 NOTE — Anesthesia Preprocedure Evaluation (Addendum)
Anesthesia Evaluation  Patient identified by MRN, date of birth, ID band Patient awake    Reviewed: Allergy & Precautions, H&P , NPO status , Patient's Chart, lab work & pertinent test results  History of Anesthesia Complications Negative for: history of anesthetic complications  Airway Mallampati: II TM Distance: >3 FB     Dental  (+) Teeth Intact   Pulmonary neg pulmonary ROS,  breath sounds clear to auscultation        Cardiovascular negative cardio ROS  Rhythm:Regular Rate:Normal     Neuro/Psych    GI/Hepatic negative GI ROS,   Endo/Other    Renal/GU      Musculoskeletal   Abdominal   Peds  Hematology   Anesthesia Other Findings   Reproductive/Obstetrics                          Anesthesia Physical Anesthesia Plan  ASA: I and emergent  Anesthesia Plan: General   Post-op Pain Management:    Induction: Intravenous, Rapid sequence and Cricoid pressure planned  Airway Management Planned: Oral ETT  Additional Equipment:   Intra-op Plan:   Post-operative Plan: Extubation in OR  Informed Consent:   Plan Discussed with:   Anesthesia Plan Comments:         Anesthesia Quick Evaluation

## 2012-07-06 NOTE — Transfer of Care (Signed)
Immediate Anesthesia Transfer of Care Note  Patient: Brandon Day  Procedure(s) Performed: Procedure(s) with comments: EVACUATION HEMATOMA (Left) - left thigh   Patient Location: PACU  Anesthesia Type:General  Level of Consciousness: awake, alert  and oriented  Airway & Oxygen Therapy: Patient Spontanous Breathing and Patient connected to face mask oxygen  Post-op Assessment: Report given to PACU RN  Post vital signs: Reviewed and stable  Complications: No apparent anesthesia complications

## 2012-07-06 NOTE — ED Notes (Signed)
Pt not NPO per EDP, pt given soda.

## 2012-07-06 NOTE — ED Notes (Signed)
Pt placed back on NPO status for procedure

## 2012-07-06 NOTE — Anesthesia Procedure Notes (Signed)
Procedure Name: Intubation Date/Time: 07/06/2012 1:17 PM Performed by: Glynn Octave E Pre-anesthesia Checklist: Patient identified, Patient being monitored, Timeout performed, Emergency Drugs available and Suction available Patient Re-evaluated:Patient Re-evaluated prior to inductionOxygen Delivery Method: Circle System Utilized Preoxygenation: Pre-oxygenation with 100% oxygen Intubation Type: IV induction, Rapid sequence and Cricoid Pressure applied Ventilation: Mask ventilation without difficulty Laryngoscope Size: Mac and 3 Grade View: Grade I Tube type: Oral Tube size: 7.0 mm Number of attempts: 1 Airway Equipment and Method: stylet Placement Confirmation: ETT inserted through vocal cords under direct vision,  positive ETCO2 and breath sounds checked- equal and bilateral Secured at: 21 cm Tube secured with: Tape Dental Injury: Teeth and Oropharynx as per pre-operative assessment
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
## 2012-07-06 NOTE — ED Notes (Signed)
Doctor Romeo Apple to see pt in ER

## 2012-07-06 NOTE — ED Notes (Signed)
MD at bedside. 

## 2012-07-06 NOTE — ED Notes (Signed)
EDP at bedside to discuss result.
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
## 2012-07-07 LAB — HEMOGLOBIN: Hemoglobin: 9.9 g/dL — ABNORMAL LOW (ref 13.0–17.0)

## 2012-07-07 MED ORDER — HYDROCODONE-ACETAMINOPHEN 10-325 MG PO TABS: 1.0000 | ORAL_TABLET | ORAL | Status: DC | PRN
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
## 2012-07-07 NOTE — Progress Notes (Signed)
Pt. Given d/c instructions, reviewed medications, given prescription, IV removed, education on dressing and bathing provided, some supplies given for next week of dressing change. Follow up appts also discussed.

## 2012-07-07 NOTE — Clinical Social Work Note (Signed)
CSW received referral on pt for SNF. Pt d/c today home. CSW signing off but can be reconsulted if needed.  Derenda Fennel, [place] [phone]
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
## 2012-07-08 ENCOUNTER — Encounter (HOSPITAL_COMMUNITY): Payer: Self-pay | Admitting: Orthopedic Surgery

## 2012-07-11 ENCOUNTER — Telehealth: Payer: Self-pay | Admitting: Orthopedic Surgery

## 2012-07-11 NOTE — Telephone Encounter (Signed)
Brandon Day is asking when he can return to work full time/full duty.  He said his boss is pressing him for a date.  He is scheduled here for suture removal 07/18/12. His # [phone]

## 2012-07-11 NOTE — Telephone Encounter (Signed)
Advised of doctor's reply °

## 2012-07-11 NOTE — Telephone Encounter (Signed)
i dont have a time yet final answer

## 2012-07-18 ENCOUNTER — Ambulatory Visit (INDEPENDENT_AMBULATORY_CARE_PROVIDER_SITE_OTHER): Payer: Self-pay | Admitting: Orthopedic Surgery

## 2012-07-18 ENCOUNTER — Encounter: Payer: Self-pay | Admitting: Orthopedic Surgery

## 2012-07-18 VITALS — BP 128/80 | Ht 72.0 in | Wt 258.0 lb

## 2012-07-18 DIAGNOSIS — T148XXA Other injury of unspecified body region, initial encounter: Secondary | ICD-10-CM | POA: Insufficient documentation

## 2012-07-18 NOTE — Patient Instructions (Addendum)
RTW Monday Activities as Tolerated

## 2012-07-18 NOTE — Progress Notes (Signed)
Patient ID: Brandon Day, male   DOB: Jul 01, 1985, 27 y.o.   MRN: [id number]   Status post evacuation of hematoma left leg with impending compartment syndrome and fasciotomy  Staples were removed today incision looks clean except for some staple erythema  Plan is to return to routine activities as tolerated and return to work on Monday

## 2012-08-04 NOTE — Progress Notes (Signed)
UR Chart Review Completed  

## 2012-08-09 ENCOUNTER — Ambulatory Visit (INDEPENDENT_AMBULATORY_CARE_PROVIDER_SITE_OTHER): Payer: Self-pay | Admitting: Orthopedic Surgery

## 2012-08-09 DIAGNOSIS — T8140XA Infection following a procedure, unspecified, initial encounter: Secondary | ICD-10-CM

## 2012-08-09 MED ORDER — SULFAMETHOXAZOLE-TRIMETHOPRIM 800-160 MG PO TABS: 1.0000 | ORAL_TABLET | Freq: Two times a day (BID) | ORAL | Status: DC
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
## 2012-08-10 ENCOUNTER — Encounter: Payer: Self-pay | Admitting: Orthopedic Surgery

## 2012-08-10 ENCOUNTER — Ambulatory Visit (INDEPENDENT_AMBULATORY_CARE_PROVIDER_SITE_OTHER): Payer: Self-pay | Admitting: Orthopedic Surgery

## 2012-08-10 VITALS — BP 124/84 | Ht 72.0 in | Wt 258.0 lb

## 2012-08-10 DIAGNOSIS — Z5189 Encounter for other specified aftercare: Secondary | ICD-10-CM

## 2012-08-10 DIAGNOSIS — T148XXA Other injury of unspecified body region, initial encounter: Secondary | ICD-10-CM

## 2012-08-10 DIAGNOSIS — T8149XA Infection following a procedure, other surgical site, initial encounter: Secondary | ICD-10-CM | POA: Insufficient documentation
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
## 2012-08-10 NOTE — Patient Instructions (Signed)
Keep dressing clean and dry continue antibiotics return tomorrow for dressing change

## 2012-08-11 ENCOUNTER — Ambulatory Visit (INDEPENDENT_AMBULATORY_CARE_PROVIDER_SITE_OTHER): Payer: Self-pay | Admitting: Orthopedic Surgery

## 2012-08-11 ENCOUNTER — Encounter: Payer: Self-pay | Admitting: Orthopedic Surgery

## 2012-08-11 VITALS — BP 126/92 | Ht 72.0 in | Wt 258.0 lb

## 2012-08-11 DIAGNOSIS — Z5189 Encounter for other specified aftercare: Secondary | ICD-10-CM

## 2012-08-11 NOTE — Progress Notes (Signed)
Patient ID: Brandon Day, male   DOB: [birth year], 27 y.o.   MRN: [id number] Chief Complaint  Patient presents with  . Follow-up    Dressing change. DOS 07-06-12.   Patient dressing change postop seroma/hematoma/wound infection  Patient on prophylactic antibiotics with wet-to-dry dressing changes  Brief debridement done today the hip granulation tissue  Patient will come back on Monday he will change his dressing daily until I see him again Monday.

## 2012-08-15 ENCOUNTER — Encounter: Payer: Self-pay | Admitting: Orthopedic Surgery

## 2012-08-15 ENCOUNTER — Ambulatory Visit (INDEPENDENT_AMBULATORY_CARE_PROVIDER_SITE_OTHER): Payer: Self-pay | Admitting: Orthopedic Surgery

## 2012-08-15 VITALS — Ht 72.0 in | Wt 258.0 lb

## 2012-08-15 DIAGNOSIS — IMO0002 Reserved for concepts with insufficient information to code with codable children: Secondary | ICD-10-CM | POA: Insufficient documentation

## 2012-08-15 DIAGNOSIS — Z5189 Encounter for other specified aftercare: Secondary | ICD-10-CM

## 2012-08-15 NOTE — Progress Notes (Signed)
Patient ID: Brandon Day, male   DOB: 1986/01/27, 27 y.o.   MRN: [id number] Chief Complaint  Patient presents with  . Follow-up    4 day recheck for dressing change. DOS 07-06-12.   The patient had surgery on the 23rd for a large hematoma. He did postoperatively developed a large seroma. I was somewhat able to express any fluid today his wound has a fibrinous exudate it is approximately 1 cm x 1.5 cm length to width and about a centimeter and a half. I was able to express any other fluid today as stated. However I with like for him to get wound care at the hospital for pulse lavage and debridement  Followup in one week continue antibiotic care. I briefly discussed with him that if wound care does not help then we may need to do a second irrigation and debridement. I would then probably go with a wound VAC because it would remove any serous fluid and help to one close faster.

## 2012-08-15 NOTE — Patient Instructions (Addendum)
Continue oral antibiotics   Start wound care at [facility] -call them for appointment to start this week

## 2012-08-16 ENCOUNTER — Ambulatory Visit (HOSPITAL_COMMUNITY)
Admission: RE | Admit: 2012-08-16 | Discharge: 2012-08-16 | Disposition: A | Discharge status: Home / Self Care | Payer: Self-pay | Source: Ambulatory Visit | Attending: Orthopedic Surgery | Admitting: Orthopedic Surgery

## 2012-08-16 DIAGNOSIS — IMO0002 Reserved for concepts with insufficient information to code with codable children: Secondary | ICD-10-CM

## 2012-08-16 DIAGNOSIS — IMO0001 Reserved for inherently not codable concepts without codable children: Secondary | ICD-10-CM | POA: Insufficient documentation

## 2012-08-16 DIAGNOSIS — T8149XA Infection following a procedure, other surgical site, initial encounter: Secondary | ICD-10-CM

## 2012-08-16 DIAGNOSIS — L02419 Cutaneous abscess of limb, unspecified: Secondary | ICD-10-CM | POA: Insufficient documentation
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
## 2012-08-17 ENCOUNTER — Ambulatory Visit (HOSPITAL_COMMUNITY)
Admission: RE | Admit: 2012-08-17 | Discharge: 2012-08-17 | Disposition: A | Discharge status: Home / Self Care | Payer: Self-pay | Source: Ambulatory Visit | Attending: Orthopedic Surgery | Admitting: Orthopedic Surgery

## 2012-08-17 NOTE — Progress Notes (Addendum)
Physical Therapy - Wound Therapy  Treatment   Patient Details  Name: Brandon Day MRN: [id number] Date of Birth: 03-03-1986  Today's Date: 08/17/2012 Time: 4098-1191 Time Calculation (min): 35 min Charges: Debridment < 20 cm Visit#: 2 of 14  Re-eval: 09/15/12  Subjective Subjective Assessment Subjective: Reports no pain.  States he has been outside getting work done.  Pain Assessment Pain Assessment Pain Assessment: No/denies pain  Wound Therapy Wound 08/16/12  Thigh Left;Distal DEEP WOUND abcess (Active)  Site / Wound Assessment Yellow 08/17/2012  2:34 PM  % Wound base Red or Granulating 25% 08/17/2012  2:34 PM  % Wound base Yellow 75% 08/17/2012  2:34 PM  % Wound base Black 0% 08/17/2012  2:34 PM  Peri-wound Assessment Intact 08/17/2012  2:34 PM  Wound Length (cm) 2 cm 08/16/2012  3:26 PM  Wound Width (cm) 1.3 cm 08/16/2012  3:26 PM  Wound Depth (cm) 1.3 cm 08/16/2012  3:26 PM  Tunneling (cm) 3.8 cm @ 3:00 08/17/2012  2:34 PM  Undermining (cm) 1.0 @ 5:00 08/16/2012  3:26 PM  Margins Attached edges (approximated) 08/17/2012  2:34 PM  Drainage Amount Minimal 08/17/2012  2:34 PM  Drainage Description Purulent;No odor 08/17/2012  2:34 PM  Treatment Cleansed;Debridement (Selective) 08/17/2012  2:34 PM  Dressing Type Gauze (Comment);Moist to moist;Tape dressing 08/17/2012  2:34 PM  Dressing Changed New 08/17/2012  2:34 PM  Dressing Status Clean;Dry;Intact;New drainage 08/17/2012  2:34 PM     Wound 08/16/12 Laceration Thigh Left;Distal superficial wound (Active)  Site / Wound Assessment Clean;Red;Yellow 08/17/2012  2:34 PM  % Wound base Red or Granulating 50% 08/17/2012  2:34 PM  % Wound base Yellow 50% 08/17/2012  2:34 PM  % Wound base Black 0% 08/17/2012  2:34 PM  % Wound base Other (Comment) 0% 08/17/2012  2:34 PM  Peri-wound Assessment Intact 08/17/2012  2:34 PM  Wound Length (cm) 2.5 cm 08/16/2012  3:26 PM  Wound Width (cm) 0.8 cm 08/16/2012  3:26 PM  Wound Depth (cm) 0.1 cm 08/16/2012  3:26 PM  Margins Attached edges  (approximated) 08/17/2012  2:34 PM  Drainage Amount Minimal 08/17/2012  2:34 PM  Treatment Cleansed;Debridement (Selective) 08/16/2012  3:26 PM  Dressing Type Gauze (Comment);Moist to dry;Tape dressing 08/17/2012  2:34 PM  Dressing Changed New 08/16/2012  3:26 PM  Dressing Status Clean;Dry;Intact 08/17/2012  2:34 PM     Incision 07/06/12 Knee Left (Active)   Selective Debridement Selective Debridement - Location: thigh deep and superficial wound Selective Debridement - Tools Used: Forceps Selective Debridement - Tissue Removed: slough; manual therapy to drain purlulent fluid   Physical Therapy Assessment and Plan Wound Therapy - Assess/Plan/Recommendations Wound Therapy - Clinical Statement: Pt had significant softening of slough today allowing for greater debridment to wound necrotic tissue.  A 3.8 cm tunnel @ 3;00 was found and utilized manual therapy to drain a significant amount of purluent fluid from the tunnel.  Packed tunnel and wound with rolled gauze and covered with extra gauze and metaphor.  Wound Plan: PLSV, continue with debridment and approprriate dressing to wound      Goals Wound Therapy Goals - Improve the function of patient's integumentary system by progressing the wound(s) through the phases of wound healing by: Decrease Necrotic Tissue to: 0% Decrease Necrotic Tissue - Progress: Progressing toward goal Increase Granulation Tissue to: 100% Increase Granulation Tissue - Progress: Progressing toward goal Decrease Length/Width/Depth by (cm): depth by 1.4 and tunneling by 1.0 Decrease Length/Width/Depth - Progress: Progressing toward goal Improve Drainage Characteristics:  Min Improve Drainage Characteristics - Progress: Progressing toward goal Patient/Family will be able to : independently care for wound Patient/Family Instruction Goal - Progress: Progressing toward goal Time For Goal Achievement:  (1 month) Wound Therapy - Potential for Goals: Good  Problem List Patient  Active Problem List   Diagnosis Date Noted  . Wound abscess 08/16/2012  . Seroma complicating a procedure 08/15/2012  . Postoperative wound infection 08/10/2012  . Hematoma 07/18/2012    GP    Phaedra Colgate, MPT ATC 08/17/2012, 3:24 PM

## 2012-08-18 ENCOUNTER — Ambulatory Visit (HOSPITAL_COMMUNITY)
Admission: RE | Admit: 2012-08-18 | Discharge: 2012-08-18 | Disposition: A | Discharge status: Home / Self Care | Payer: Self-pay | Source: Ambulatory Visit | Attending: Orthopedic Surgery | Admitting: Orthopedic Surgery

## 2012-08-18 NOTE — Progress Notes (Signed)
Physical Therapy - Wound Therapy  Treatment   Patient Details  Name: Brandon Day MRN: [id number] Date of Birth: May 06, 1985  Today's Date: 08/18/2012 Time: 1478-2956 Time Calculation (min): 28 min Charge:  Debridement x 1 Visit#: 3 of 14  Re-eval: 09/15/12  Subjective Subjective Assessment Subjective: Pt states the he can not feel the wound itself but the skin around the wound where the tape has been is very sore.  Pain Assessment Pain Assessment Pain Assessment: 0-10 Pain Score:   3 Pain Type: Chronic pain Pain Location: Leg Pain Orientation: Left Pain Descriptors / Indicators: Discomfort Patients Stated Pain Goal: 0 Pain Intervention(s):  (changed dressing to compression with coban; no tape used)  Wound Therapy Wound 08/16/12  Thigh Left;Distal DEEP WOUND abcess (Active)  Site / Wound Assessment Red;Yellow 08/18/2012  2:23 PM  % Wound base Red or Granulating 35% 08/18/2012  2:23 PM  % Wound base Yellow 65% 08/18/2012  2:23 PM  % Wound base Black 0% 08/18/2012  2:23 PM  Peri-wound Assessment Edema;Erythema (blanchable) 08/18/2012  2:23 PM  Wound Length (cm) 2 cm 08/16/2012  3:26 PM  Wound Width (cm) 1.3 cm 08/16/2012  3:26 PM  Wound Depth (cm) 1.3 cm 08/16/2012  3:26 PM  Tunneling (cm) tunnel is at least 5 cm 08/18/2012  2:23 PM  Undermining (cm) undermining is at least 3 cm 08/18/2012  2:23 PM  Margins Attached edges (approximated) 08/18/2012  2:23 PM  Closure None 08/18/2012  2:23 PM  Drainage Amount Moderate 08/18/2012  2:23 PM  Drainage Description Purulent;No odor 08/18/2012  2:23 PM  Non-staged Wound Description Full thickness 08/18/2012  2:23 PM  Treatment Hydrotherapy (Pulse lavage);Cleansed 08/18/2012  2:23 PM  Dressing Type Moist to moist;Hydrogel;Honeycomb;Compression wrap 08/18/2012  2:23 PM  Dressing Changed Changed 08/18/2012  2:23 PM  Dressing Status Clean;Dry 08/18/2012  2:23 PM     Wound 08/16/12 Laceration Thigh Left;Distal superficial wound (Active)  Site / Wound Assessment  Clean;Red;Yellow 08/18/2012  2:23 PM  % Wound base Red or Granulating 60% 08/18/2012  2:23 PM  % Wound base Yellow 40% 08/18/2012  2:23 PM  % Wound base Black 0% 08/17/2012  2:34 PM  % Wound base Other (Comment) 0% 08/17/2012  2:34 PM  Peri-wound Assessment Intact 08/17/2012  2:34 PM  Wound Length (cm) 2.5 cm 08/16/2012  3:26 PM  Wound Width (cm) 0.8 cm 08/16/2012  3:26 PM  Wound Depth (cm) 0.1 cm 08/16/2012  3:26 PM  Margins Attached edges (approximated) 08/18/2012  2:23 PM  Drainage Amount Minimal 08/18/2012  2:23 PM  Treatment Cleansed;Hydrotherapy (Pulse lavage) 08/18/2012  2:23 PM  Dressing Type Moist to moist 08/18/2012  2:23 PM  Dressing Changed New 08/18/2012  2:23 PM  Dressing Status Clean 08/18/2012  2:23 PM     Incision 07/06/12 Knee Left (Active)   Hydrotherapy Pulsed lavage therapy - wound location: entire wound Pulsed Lavage with Suction (psi): 4 psi Pulsed Lavage with Suction - Normal Saline Used: 1000 mL Pulsed Lavage Tip: Tip with splash shield Selective Debridement Selective Debridement - Location: thigh area  Selective Debridement - Tools Used: Forceps Selective Debridement - Tissue Removed: slough   Physical Therapy Assessment and Plan Wound Therapy - Assess/Plan/Recommendations Wound Therapy - Clinical Statement: Pt has increased drainage today but decreased necrotic tissue.  Tunnel was not measured but appears to be longer than 3.8 cm will measure tomorrow. Manual appears to have improved congested fluid as there was minimal fluid able to be pushed out with manual. Factors Delaying/Impairing Wound  Healing: Altered sensation Hydrotherapy Plan: Debridement;Dressing change;Patient/family education;Pulsatile lavage with suction Wound Therapy - Frequency: 5X / week (to decrease to 3x/week.) Wound Therapy - Current Recommendations: PT Wound Plan: PLSV, continue with debridment and approprriate dressing to wound      Goals    Problem List Patient Active Problem List   Diagnosis Date Noted   . Wound abscess 08/16/2012  . Seroma complicating a procedure 08/15/2012  . Postoperative wound infection 08/10/2012  . Hematoma 07/18/2012    GP    RUSSELL,CINDY 08/18/2012, 2:37 PM

## 2012-08-19 ENCOUNTER — Ambulatory Visit (HOSPITAL_COMMUNITY)
Admission: RE | Admit: 2012-08-19 | Discharge: 2012-08-19 | Disposition: A | Discharge status: Home / Self Care | Payer: Self-pay | Source: Ambulatory Visit | Attending: Orthopedic Surgery | Admitting: Orthopedic Surgery

## 2012-08-19 NOTE — Progress Notes (Signed)
Physical Therapy - Wound Therapy  Treatment   Patient Details  Name: Brandon Day MRN: [id number] Date of Birth: 1986-02-15  Today's Date: 08/19/2012 Time: 9147-8295 Time Calculation (min): 40 min Charge Deb < 20cm Visit#: 4 of 14  Re-eval: 09/15/12  Physical Therapy Assessment and Plan Wound Therapy - Assess/Plan/Recommendations Wound Therapy - Clinical Statement: Pt has attended wound care everyday this week and has had a decrease in necrotic tissue, however has increased in tunnel findings.  Currently at 6cm for tunnel and 3 cm for undermining.  Packed wound tightly with gauze and honeygel to promote healing.  Pt will f/u with MD on Monday to discuss further options.  At this time would continue to suggest OP PT for wound care 1-2x/week and educate on wound care at home, as pt has finacial difficulty.  Wound Plan: f/u with MD apt.    Subjective Subjective Assessment Subjective: Reports he is doing well.  No difficulty with the dressing.   Pain Assessment Pain Assessment Pain Assessment: No/denies pain  Wound Therapy Wound 08/16/12  Thigh Left;Distal DEEP WOUND abcess (Active)  Site / Wound Assessment Red;Yellow 08/19/2012 12:20 PM  % Wound base Red or Granulating 35% 08/19/2012 12:20 PM  % Wound base Yellow 65% 08/19/2012 12:20 PM  % Wound base Black 0% 08/19/2012 12:20 PM  Peri-wound Assessment Edema;Erythema (blanchable) 08/19/2012 12:20 PM  Wound Length (cm) 2 cm 08/19/2012 12:20 PM  Wound Width (cm) 1.3 cm 08/19/2012 12:20 PM  Wound Depth (cm) 1.3 cm 08/19/2012 12:20 PM  Tunneling (cm)  6 cm @ 1-3 O'clock 08/19/2012 12:20 PM  Undermining (cm) 3 cm at 3 O'clock 08/19/2012 12:20 PM  Margins Attached edges (approximated) 08/19/2012 12:20 PM  Closure None 08/19/2012 12:20 PM  Drainage Amount Moderate 08/19/2012 12:20 PM  Drainage Description Purulent;No odor 08/19/2012 12:20 PM  Non-staged Wound Description Full thickness 08/19/2012 12:20 PM  Treatment Hydrotherapy (Pulse lavage);Cleansed 08/18/2012   2:23 PM  Dressing Type Moist to moist;Hydrogel;Honeycomb;Compression wrap 08/19/2012 12:20 PM  Dressing Changed Changed 08/18/2012  2:23 PM  Dressing Status Clean;Dry 08/19/2012 12:20 PM     Wound 08/16/12 Laceration Thigh Left;Distal superficial wound (Active)  Site / Wound Assessment Clean;Red;Yellow 08/19/2012 12:20 PM  % Wound base Red or Granulating 60% 08/19/2012 12:20 PM  % Wound base Yellow 40% 08/19/2012 12:20 PM  % Wound base Black 0% 08/17/2012  2:34 PM  % Wound base Other (Comment) 0% 08/17/2012  2:34 PM  Peri-wound Assessment Intact 08/17/2012  2:34 PM  Wound Length (cm) 2.5 cm 08/16/2012  3:26 PM  Wound Width (cm) 0.8 cm 08/16/2012  3:26 PM  Wound Depth (cm) 0.1 cm 08/16/2012  3:26 PM  Margins Attached edges (approximated) 08/19/2012 12:20 PM  Drainage Amount Minimal 08/19/2012 12:20 PM  Treatment Cleansed;Hydrotherapy (Pulse lavage) 08/18/2012  2:23 PM  Dressing Type Moist to moist 08/19/2012 12:20 PM  Dressing Changed New 08/18/2012  2:23 PM  Dressing Status Clean 08/19/2012 12:20 PM     Incision 07/06/12 Knee Left (Active)   Hydrotherapy Pulsed lavage therapy - wound location: entire wound Pulsed Lavage with Suction (psi): 4 psi Pulsed Lavage with Suction - Normal Saline Used: 1000 mL Pulsed Lavage Tip: Tip with splash shield Selective Debridement Selective Debridement - Location: thigh area  Selective Debridement - Tools Used: Forceps Selective Debridement - Tissue Removed: slough        Goals    Problem List Patient Active Problem List   Diagnosis Date Noted  . Wound abscess 08/16/2012  . Seroma complicating  a procedure 08/15/2012  . Postoperative wound infection 08/10/2012  . Hematoma 07/18/2012    GP    [place]ana Fassnacht, MPT ATC 08/19/2012, 12:27 PM

## 2012-08-22 ENCOUNTER — Ambulatory Visit (HOSPITAL_COMMUNITY): Payer: Self-pay | Admitting: *Deleted

## 2012-08-22 ENCOUNTER — Ambulatory Visit (INDEPENDENT_AMBULATORY_CARE_PROVIDER_SITE_OTHER): Payer: Self-pay | Admitting: Orthopedic Surgery

## 2012-08-22 VITALS — BP 122/70 | Ht 72.0 in | Wt 258.0 lb

## 2012-08-22 DIAGNOSIS — Z5189 Encounter for other specified aftercare: Secondary | ICD-10-CM

## 2012-08-22 NOTE — Progress Notes (Signed)
Patient ID: Brandon Day, male   DOB: 12-02-1985, 27 y.o.   MRN: [id number] Chief Complaint  Patient presents with  . Follow-up    1 week recheck left leg DOS 07/06/12    S/P i/d EVACUATION OF HEMATOMA LEFT THIGH   DEVELOPED WOUND DRAINAGE AND INFECTION  CURRENT TREATMENT  WOUND CARE  PO ANTIBIOTICS   WOUND IS DEEP  GRANULATION BED IS GOOD   CONTINUE WOUND CARE AND PO ANTIBIOTICS

## 2012-08-22 NOTE — Patient Instructions (Signed)
CONTINUE WOUND CARE AND PO ANTIBIOTICS

## 2012-08-22 NOTE — Addendum Note (Signed)
Addended by: Vickki Hearing on: 08/22/2012 04:23 PM   Modules accepted: Orders

## 2012-08-24 ENCOUNTER — Ambulatory Visit (HOSPITAL_COMMUNITY)
Admission: RE | Admit: 2012-08-24 | Discharge: 2012-08-24 | Disposition: A | Discharge status: Home / Self Care | Payer: Self-pay | Source: Ambulatory Visit | Attending: Orthopedic Surgery | Admitting: Orthopedic Surgery

## 2012-08-24 NOTE — Progress Notes (Signed)
Physical Therapy - Wound Therapy  Treatment   Patient Details  Name: Brandon Day MRN: [id number] Date of Birth: 1985/08/04  Today's Date: 08/24/2012 Time: 4098-1191 Time Calculation (min): 35 min  Visit#: 5 of 14  Re-eval: 09/15/12 Charges: Selective debridement (= or < 20 cm)   Subjective Subjective Assessment Subjective: Pt states that he is having some soreness in his proximal inner thigh.   Pain Assessment Pain Assessment Pain Score:   2 Pain Location: Leg Pain Orientation: Left  Wound Therapy Wound 08/16/12  Thigh Left;Distal DEEP WOUND abcess (Active)  Site / Wound Assessment Red;Yellow 08/24/2012  2:37 PM  % Wound base Red or Granulating 20% 08/24/2012  2:37 PM  % Wound base Yellow 80% 08/24/2012  2:37 PM  % Wound base Black 0% 08/24/2012  2:37 PM  Peri-wound Assessment Edema;Erythema (blanchable) 08/24/2012  2:37 PM  Wound Length (cm) 2 cm 08/19/2012 12:20 PM  Wound Width (cm) 1.3 cm 08/19/2012 12:20 PM  Wound Depth (cm) 1.3 cm 08/19/2012 12:20 PM  Tunneling (cm)  6 cm @ 1-3 O'clock 08/19/2012 12:20 PM  Undermining (cm) 3 cm at 3 O'clock 08/19/2012 12:20 PM  Margins Attached edges (approximated) 08/24/2012  2:37 PM  Closure None 08/24/2012  2:37 PM  Drainage Amount Moderate 08/24/2012  2:37 PM  Drainage Description Purulent;Odor 08/24/2012  2:37 PM  Non-staged Wound Description Full thickness 08/24/2012  2:37 PM  Treatment Cleansed;Debridement (Selective) 08/24/2012  2:37 PM  Dressing Type Moist to moist;Hydrogel;Honeycomb;Compression wrap 08/24/2012  2:37 PM  Dressing Changed Changed 08/18/2012  2:23 PM  Dressing Status Clean;Dry 08/24/2012  2:37 PM     Wound 08/16/12 Laceration Thigh Left;Distal superficial wound (Active)  Site / Wound Assessment Clean;Red;Yellow 08/24/2012  2:37 PM  % Wound base Red or Granulating 60% 08/24/2012  2:37 PM  % Wound base Yellow 40% 08/24/2012  2:37 PM  % Wound base Black 0% 08/17/2012  2:34 PM  % Wound base Other (Comment) 0% 08/17/2012  2:34 PM   Peri-wound Assessment Intact 08/17/2012  2:34 PM  Wound Length (cm) 2.5 cm 08/16/2012  3:26 PM  Wound Width (cm) 0.8 cm 08/16/2012  3:26 PM  Wound Depth (cm) 0.1 cm 08/16/2012  3:26 PM  Margins Attached edges (approximated) 08/24/2012  2:37 PM  Drainage Amount Minimal 08/24/2012  2:37 PM  Treatment Cleansed;Debridement (Selective);Hydrotherapy (Pulse lavage) 08/24/2012  2:37 PM  Dressing Type Moist to moist;Silver hydrofiber 08/24/2012  2:37 PM  Dressing Changed New 08/24/2012  2:37 PM  Dressing Status Clean 08/24/2012  2:37 PM     Incision 07/06/12 Knee Left (Active)   Hydrotherapy Pulsed lavage therapy - wound location: entire wound Pulsed Lavage with Suction (psi): 4 psi Pulsed Lavage with Suction - Normal Saline Used: 1000 mL Pulsed Lavage Tip: Tip with splash shield Selective Debridement Selective Debridement - Location: thigh area  Selective Debridement - Tools Used: Forceps Selective Debridement - Tissue Removed: slough and dry skin   Physical Therapy Assessment and Plan Wound Therapy - Assess/Plan/Recommendations Wound Therapy - Clinical Statement: Wound initially presents with odor which decreased after pulse lavage. Pt tolerates debridement well. Packed wound with hydrogel covered soft-form gauze. Covered visible wound bed with silver hydrofiber to limit drainage and odor. Wound dressed with abd pad and compression wrap. Pt reports 0/10 pain at end of session. Wound Plan: Continue wound care per PT POC.  Problem List Patient Active Problem List   Diagnosis Date Noted  . Wound abscess 08/16/2012  . Seroma complicating a procedure 08/15/2012  . Postoperative  wound infection 08/10/2012  . Hematoma 07/18/2012   Seth Bake, PTA 08/24/2012, 4:20 PM

## 2012-08-26 ENCOUNTER — Ambulatory Visit (HOSPITAL_COMMUNITY)
Admission: RE | Admit: 2012-08-26 | Discharge: 2012-08-26 | Disposition: A | Discharge status: Home / Self Care | Payer: Self-pay | Source: Ambulatory Visit | Attending: Orthopedic Surgery | Admitting: Orthopedic Surgery

## 2012-08-26 NOTE — Progress Notes (Signed)
Physical Therapy - Wound Therapy  Treatment   Patient Details  Name: Brandon Day MRN: [id number] Date of Birth: 08/05/1985  Today's Date: 08/26/2012 Time: 3244-0102 Time Calculation (min): 20 min  Visit#: 6 of 14  Re-eval: 09/15/12  Physical Therapy Assessment and Plan Wound Therapy - Assess/Plan/Recommendations Wound Therapy - Clinical Statement: Wound is showing excellent signs of healing with 100% granulation, sanginieus fluid, depth has decreased to 5 cm (was 6 cm) and underming to 2.5 cm (was 3 cm).  Pt will f/u with MD on Monday, have scheduled pt 2x next week if needed to continue with Pulse lavage.   Wound Plan: Continue wound care per PT POC.   Subjective Subjective Assessment Subjective: Pt states that his leg is still sore.    Pain Assessment Pain Assessment Pain Score:   2 Pain Orientation: Left  Wound Therapy Wound 08/16/12  Thigh Left;Distal DEEP WOUND abcess (Active)  Site / Wound Assessment Red;Yellow 08/26/2012 12:19 PM  % Wound base Red or Granulating 100% 08/26/2012 12:19 PM  % Wound base Yellow 0% 08/26/2012 12:19 PM  % Wound base Black 0% 08/26/2012 12:19 PM  Peri-wound Assessment Edema;Erythema (blanchable) 08/26/2012 12:19 PM  Wound Length (cm) 2 cm 08/26/2012 12:19 PM  Wound Width (cm) 1.3 cm 08/26/2012 12:19 PM  Wound Depth (cm) 1.3 cm 08/26/2012 12:19 PM  Tunneling (cm) 5 cm @ 1-3 O'clock (was 6 cm) 08/26/2012 12:19 PM  Undermining (cm) 2.5 cm (was 3 cm) 08/26/2012 12:19 PM  Margins Attached edges (approximated) 08/26/2012 12:19 PM  Closure None 08/26/2012 12:19 PM  Drainage Amount Moderate 08/26/2012 12:19 PM  Drainage Description Odor;Sanguineous 08/26/2012 12:19 PM  Non-staged Wound Description Full thickness 08/26/2012 12:19 PM  Treatment Cleansed;Debridement (Selective) 08/24/2012  2:37 PM  Dressing Type Moist to moist;Compression wrap;Abdominal binder 08/26/2012 12:19 PM  Dressing Changed Changed 08/18/2012  2:23 PM  Dressing Status Clean;Dry 08/26/2012  12:19 PM     Wound 08/16/12 Laceration Thigh Left;Distal superficial wound (Active)  Site / Wound Assessment Clean;Red;Yellow 08/26/2012 12:19 PM  % Wound base Red or Granulating 60% 08/26/2012 12:19 PM  % Wound base Yellow 40% 08/26/2012 12:19 PM  % Wound base Black 0% 08/17/2012  2:34 PM  % Wound base Other (Comment) 0% 08/17/2012  2:34 PM  Peri-wound Assessment Intact 08/17/2012  2:34 PM  Wound Length (cm) 2.5 cm 08/16/2012  3:26 PM  Wound Width (cm) 0.8 cm 08/16/2012  3:26 PM  Wound Depth (cm) 0.1 cm 08/16/2012  3:26 PM  Margins Attached edges (approximated) 08/26/2012 12:19 PM  Drainage Amount Minimal 08/26/2012 12:19 PM  Treatment Cleansed;Debridement (Selective);Hydrotherapy (Pulse lavage) 08/24/2012  2:37 PM  Dressing Type Moist to moist;Silver hydrofiber 08/26/2012 12:19 PM  Dressing Changed New 08/24/2012  2:37 PM  Dressing Status Clean 08/26/2012 12:19 PM     Incision 07/06/12 Knee Left (Active)   Hydrotherapy Pulsed lavage therapy - wound location: entire wound Pulsed Lavage with Suction (psi): 4 psi Pulsed Lavage with Suction - Normal Saline Used: 1000 mL Pulsed Lavage Tip: Tip with splash shield Selective Debridement Selective Debridement - Location: thigh area  Selective Debridement - Tools Used: Forceps Selective Debridement - Tissue Removed: slough and dry skin    Goals    Problem List Patient Active Problem List   Diagnosis Date Noted  . Wound abscess 08/16/2012  . Seroma complicating a procedure 08/15/2012  . Postoperative wound infection 08/10/2012  . Hematoma 07/18/2012    GP    Brandon Day 08/26/2012, 12:22 PM

## 2012-08-29 ENCOUNTER — Ambulatory Visit (INDEPENDENT_AMBULATORY_CARE_PROVIDER_SITE_OTHER): Payer: Self-pay | Admitting: Orthopedic Surgery

## 2012-08-29 VITALS — Ht 72.0 in | Wt 258.0 lb

## 2012-08-29 DIAGNOSIS — Z5189 Encounter for other specified aftercare: Secondary | ICD-10-CM

## 2012-08-29 NOTE — Patient Instructions (Addendum)
Continue wound care. ?

## 2012-08-30 NOTE — Progress Notes (Signed)
Patient ID: Brandon Day, male   DOB: [birth year], 27 y.o.   MRN: [id number] Status post incision and drainage of hematoma posttraumatic with impending compartment syndrome did well until about 3 or 4 weeks ago started having drainage she's been receiving wound care daily at the hospital  His wound is clean. Sanguinous drainage. It is repacked. He is advised to continue wound care and antibiotics and come back in a week

## 2012-08-31 ENCOUNTER — Ambulatory Visit (HOSPITAL_COMMUNITY)
Admission: RE | Admit: 2012-08-31 | Discharge: 2012-08-31 | Disposition: A | Discharge status: Home / Self Care | Payer: Self-pay | Source: Ambulatory Visit | Attending: Orthopedic Surgery | Admitting: Orthopedic Surgery

## 2012-08-31 NOTE — Progress Notes (Signed)
Physical Therapy - Wound Therapy  Treatment   Patient Details  Name: Brandon Day MRN: [id number] Date of Birth: March 13, 1986  Today's Date: 08/31/2012 Time: 1105-1130 Time Calculation (min): 25 min Charges: Selective debridement (= or < 20 cm)   Visit#: 7 of 14  Re-eval: 09/15/12  Subjective Subjective Assessment Subjective: Pt is pain free and without complaint.  Pain Assessment Pain Assessment Pain Assessment: No/denies pain  Wound Therapy Wound 08/16/12  Thigh Left;Distal DEEP WOUND abcess (Active)  Site / Wound Assessment Red;Yellow 08/31/2012 11:43 AM  % Wound base Red or Granulating 100% 08/31/2012 11:43 AM  % Wound base Yellow 0% 08/31/2012 11:43 AM  % Wound base Black 0% 08/31/2012 11:43 AM  Peri-wound Assessment Edema;Erythema (blanchable) 08/31/2012 11:43 AM  Wound Length (cm) 2 cm 08/26/2012 12:19 PM  Wound Width (cm) 1.3 cm 08/26/2012 12:19 PM  Wound Depth (cm) 1.3 cm 08/26/2012 12:19 PM  Tunneling (cm) 5cm 08/31/2012 11:43 AM  Undermining (cm) 2.5 cm (was 3 cm) 08/26/2012 12:19 PM  Margins Attached edges (approximated) 08/31/2012 11:43 AM  Closure None 08/31/2012 11:43 AM  Drainage Amount Moderate 08/31/2012 11:43 AM  Drainage Description Odor;Sanguineous 08/31/2012 11:43 AM  Non-staged Wound Description Full thickness 08/31/2012 11:43 AM  Treatment Cleansed;Debridement (Selective);Hydrotherapy (Pulse lavage) 08/31/2012 11:43 AM  Dressing Type Moist to moist;Compression wrap;Abdominal binder 08/31/2012 11:43 AM  Dressing Changed Changed 08/18/2012  2:23 PM  Dressing Status Clean;Dry 08/31/2012 11:43 AM     Wound 08/16/12 Laceration Thigh Left;Distal superficial wound (Active)  Site / Wound Assessment Clean;Red;Yellow 08/31/2012 11:43 AM  % Wound base Red or Granulating 0% 08/31/2012 11:43 AM  % Wound base Yellow 100% 08/31/2012 11:43 AM  % Wound base Black 0% 08/17/2012  2:34 PM  % Wound base Other (Comment) 0% 08/17/2012  2:34 PM  Peri-wound Assessment Intact 08/17/2012  2:34 PM   Wound Length (cm) 2.5 cm 08/16/2012  3:26 PM  Wound Width (cm) 0.8 cm 08/16/2012  3:26 PM  Wound Depth (cm) 0.1 cm 08/16/2012  3:26 PM  Margins Attached edges (approximated) 08/31/2012 11:43 AM  Drainage Amount Minimal 08/31/2012 11:43 AM  Treatment Cleansed;Debridement (Selective) 08/31/2012 11:43 AM  Dressing Type Moist to moist;Silver hydrofiber 08/31/2012 11:43 AM  Dressing Changed New 08/24/2012  2:37 PM  Dressing Status Clean 08/31/2012 11:43 AM     Incision 07/06/12 Knee Left (Active)   Hydrotherapy Pulsed lavage therapy - wound location: entire wound Pulsed Lavage with Suction (psi): 4 psi Pulsed Lavage with Suction - Normal Saline Used: 1000 mL Pulsed Lavage Tip: Tip with splash shield Selective Debridement Selective Debridement - Location: thigh area  Selective Debridement - Tools Used: Forceps Selective Debridement - Tissue Removed: slough and dry skin   Physical Therapy Assessment and Plan Wound Therapy - Assess/Plan/Recommendations Wound Therapy - Clinical Statement: Wound continues to progress well. Wound is granulating well. Tunneling remains and 5cm. Wound is without signs or sx of infection. Will competes wound care 5x week per MD order.  Wound Plan: Continue wound care x5/week per MD.  Problem List Patient Active Problem List   Diagnosis Date Noted  . Wound abscess 08/16/2012  . Seroma complicating a procedure 08/15/2012  . Postoperative wound infection 08/10/2012  . Hematoma 07/18/2012    Seth Bake, PTA  08/31/2012, 11:53 AM

## 2012-09-01 ENCOUNTER — Ambulatory Visit (HOSPITAL_COMMUNITY)
Admission: RE | Admit: 2012-09-01 | Discharge: 2012-09-01 | Disposition: A | Discharge status: Home / Self Care | Payer: Self-pay | Source: Ambulatory Visit

## 2012-09-01 NOTE — Progress Notes (Signed)
Physical Therapy - Wound Therapy  Treatment   Patient Details  Name: Brandon Day MRN: [id number] Date of Birth: 1985-03-20  Today's Date: 09/01/2012 Time: 7829-5621 Time Calculation (min): 38 min  Visit#: 8 of 14  Re-eval: 09/15/12  Subjective Subjective Assessment Subjective: No c/o  Pain Assessment Pain Assessment Pain Assessment: No/denies pain  Wound Therapy  09/01/12 1817  Subjective Assessment  Subjective No c/o  Wound  Date First Assessed: 08/16/12   Wound Type: (c)   Location: Thigh  Location Orientation: Left;Distal  Wound Description (Comments): DEEP WOUND abcess  Site / Wound Assessment Red;Yellow  % Wound base Red or Granulating 100%  % Wound base Yellow 0%  % Wound base Black 0%  Peri-wound Assessment Edema;Erythema (blanchable)  Tunneling (cm) 4.8  Margins Attached edges (approximated)  Closure None  Drainage Amount Moderate  Drainage Description Odor;Sanguineous  Non-staged Wound Description Full thickness  Treatment Cleansed;Debridement (Selective);Hydrotherapy (Pulse lavage)  Dressing Type Moist to moist;Compression wrap;Abdominal binder  Dressing Status Clean;Dry  Wound  Date First Assessed: 08/16/12   Wound Type: Laceration  Location: Thigh  Location Orientation: Left;Distal  Wound Description (Comments): superficial wound  Present on Admission: Yes  Site / Wound Assessment Clean;Red;Yellow  % Wound base Red or Granulating 50%  % Wound base Yellow 50%  Margins Attached edges (approximated)  Drainage Amount Minimal  Treatment Cleansed;Debridement (Selective)  Dressing Type Moist to moist;Silver hydrofiber  Dressing Status Clean  Hydrotherapy  Pulsed lavage therapy - wound location entire wound  Pulsed Lavage with Suction (psi) 4 psi  Pulsed Lavage with Suction - Normal Saline Used 1000 mL  Pulsed Lavage Tip Tip with splash shield  Selective Debridement  Selective Debridement - Location thigh area   Selective Debridement - Tools Used Forceps   Selective Debridement - Tissue Removed slough and dry skin  Wound Therapy - Assess/Plan/Recommendations  Wound Therapy - Clinical Statement Wound continues to progress well. Wound improving in granulation,  Decreased depth 4.8 cm. No signs of infection.    Wound Plan Continue wound care x5/week per MD.    Hydrotherapy Pulsed lavage therapy - wound location: entire wound Pulsed Lavage with Suction (psi): 4 psi Pulsed Lavage with Suction - Normal Saline Used: 1000 mL Pulsed Lavage Tip: Tip with splash shield Selective Debridement Selective Debridement - Location: thigh area  Selective Debridement - Tools Used: Forceps Selective Debridement - Tissue Removed: slough and dry skin   Physical Therapy Assessment and Plan Wound Therapy - Assess/Plan/Recommendations Wound Therapy - Clinical Statement: Wound continues to progress well. Wound improving in granulation,  Decreased depth 4.8 cm. No signs of infection.   Wound Plan: Continue wound care x5/week per MD.      Goals    Problem List Patient Active Problem List   Diagnosis Date Noted  . Wound abscess 08/16/2012  . Seroma complicating a procedure 08/15/2012  . Postoperative wound infection 08/10/2012  . Hematoma 07/18/2012    GP    [name] 09/01/2012, 6:25 PM

## 2012-09-02 ENCOUNTER — Ambulatory Visit (HOSPITAL_COMMUNITY)
Admission: RE | Admit: 2012-09-02 | Discharge: 2012-09-02 | Disposition: A | Discharge status: Home / Self Care | Payer: Self-pay | Source: Ambulatory Visit

## 2012-09-02 NOTE — Progress Notes (Signed)
Physical Therapy - Wound Therapy  Treatment   Patient Details  Name: Brandon Day MRN: [id number] Date of Birth: 22-Nov-1985  Today's Date: 09/02/2012 Time: 1610-1650 Time Calculation (min): 40 min Charge: selective debridement <20 cm  Visit#: 9 of 14  Re-eval: 09/15/12  Subjective Subjective Assessment Subjective: Pt 10 minutes late for apt today, no pain today.  Pain Assessment Pain Assessment Pain Assessment: No/denies pain  Wound Therapy  09/02/12 1757  Subjective Assessment  Subjective Pt 10 minutes late for apt today, no pain today.  Wound  Date First Assessed: 08/16/12   Wound Type: (c)   Location: Thigh  Location Orientation: Left;Distal  Wound Description (Comments): DEEP WOUND abcess  Site / Wound Assessment Red;Yellow  % Wound base Red or Granulating 100%  % Wound base Yellow 0%  Peri-wound Assessment Edema;Erythema (blanchable)  Margins Attached edges (approximated)  Closure None  Drainage Amount Moderate  Drainage Description Odor;Sanguineous  Non-staged Wound Description Full thickness  Dressing Type Moist to moist;Alginate;Silver hydrofiber;ABD;Gauze (Comment) (Ace bandage)  Dressing Status Clean;Dry  Wound  Date First Assessed: 08/16/12   Wound Type: Laceration  Location: Thigh  Location Orientation: Left;Distal  Wound Description (Comments): superficial wound  Present on Admission: Yes  Site / Wound Assessment Clean;Red;Yellow  % Wound base Red or Granulating 75%  % Wound base Yellow 25%  Margins Attached edges (approximated)  Drainage Amount Minimal  Treatment Cleansed;Debridement (Selective)  Dressing Type Moist to moist;Silver hydrofiber  Dressing Status Clean  Hydrotherapy  Pulsed lavage therapy - wound location entire wound  Pulsed Lavage with Suction (psi) 4 psi  Pulsed Lavage with Suction - Normal Saline Used 1000 mL  Pulsed Lavage Tip Tip with splash shield  Selective Debridement  Selective Debridement - Location thigh area   Selective  Debridement - Tools Used Forceps;Other (comment) (qtips)  Selective Debridement - Tissue Removed slough and dry skin  Wound Therapy - Assess/Plan/Recommendations  Wound Therapy - Clinical Statement Wound continues to progress well.  100% granulation on proximal wound following debridement, 75% distal  Continued packing tunnel, added alginate and ABD pad for the drainage over weekend.    Wound Plan Continue wound care x5/week per MD.    Hydrotherapy Pulsed lavage therapy - wound location: entire wound Pulsed Lavage with Suction (psi): 4 psi Pulsed Lavage with Suction - Normal Saline Used: 1000 mL Pulsed Lavage Tip: Tip with splash shield Selective Debridement Selective Debridement - Location: thigh area  Selective Debridement - Tools Used: Forceps;Other (comment) (qtips) Selective Debridement - Tissue Removed: slough and dry skin   Physical Therapy Assessment and Plan Wound Therapy - Assess/Plan/Recommendations Wound Therapy - Clinical Statement: Wound continues to progress well.  100% granulation on proximal wound following debridement, 75% distal  Continued packing tunnel, added alginate and ABD pad for the drainage over weekend.   Wound Plan: Continue wound care x5/week per MD.      Goals    Problem List Patient Active Problem List   Diagnosis Date Noted  . Wound abscess 08/16/2012  . Seroma complicating a procedure 08/15/2012  . Postoperative wound infection 08/10/2012  . Hematoma 07/18/2012    GP    [name] 09/02/2012, 6:04 PM

## 2012-09-05 ENCOUNTER — Ambulatory Visit (INDEPENDENT_AMBULATORY_CARE_PROVIDER_SITE_OTHER): Payer: Self-pay | Admitting: Orthopedic Surgery

## 2012-09-05 ENCOUNTER — Encounter: Payer: Self-pay | Admitting: Orthopedic Surgery

## 2012-09-05 VITALS — BP 138/90 | Ht 72.0 in | Wt 258.0 lb

## 2012-09-05 DIAGNOSIS — T8140XA Infection following a procedure, unspecified, initial encounter: Secondary | ICD-10-CM

## 2012-09-05 MED ORDER — SULFAMETHOXAZOLE-TRIMETHOPRIM 800-160 MG PO TABS: 1.0000 | ORAL_TABLET | Freq: Two times a day (BID) | ORAL | Status: DC

## 2012-09-05 NOTE — Progress Notes (Signed)
Patient ID: Brandon Day, male   DOB: 03/26/1985, 27 y.o.   MRN: [id number] Left thigh wound check  Status post seroma after evacuation of hematoma  His wound has cleaned up very well he should continue his antibiotics and dressing changes and followup for wound check

## 2012-09-06 ENCOUNTER — Telehealth: Payer: Self-pay | Admitting: Orthopedic Surgery

## 2012-09-06 ENCOUNTER — Emergency Department (HOSPITAL_COMMUNITY)
Admission: EM | Admit: 2012-09-06 | Discharge: 2012-09-06 | Disposition: A | Discharge status: Home / Self Care | Payer: Self-pay | Attending: Emergency Medicine | Admitting: Emergency Medicine

## 2012-09-06 ENCOUNTER — Ambulatory Visit (HOSPITAL_COMMUNITY)
Admission: RE | Admit: 2012-09-06 | Discharge: 2012-09-06 | Disposition: A | Discharge status: Home / Self Care | Payer: Self-pay | Source: Ambulatory Visit | Attending: Orthopedic Surgery | Admitting: Orthopedic Surgery

## 2012-09-06 ENCOUNTER — Encounter (HOSPITAL_COMMUNITY): Payer: Self-pay

## 2012-09-06 DIAGNOSIS — M7989 Other specified soft tissue disorders: Secondary | ICD-10-CM | POA: Insufficient documentation

## 2012-09-06 DIAGNOSIS — Z86718 Personal history of other venous thrombosis and embolism: Secondary | ICD-10-CM | POA: Insufficient documentation

## 2012-09-06 DIAGNOSIS — Z87828 Personal history of other (healed) physical injury and trauma: Secondary | ICD-10-CM | POA: Insufficient documentation

## 2012-09-06 DIAGNOSIS — Z79899 Other long term (current) drug therapy: Secondary | ICD-10-CM | POA: Insufficient documentation

## 2012-09-06 DIAGNOSIS — F17200 Nicotine dependence, unspecified, uncomplicated: Secondary | ICD-10-CM | POA: Insufficient documentation

## 2012-09-06 LAB — POCT I-STAT, CHEM 8
BUN: 12 mg/dL (ref 6–23)
Chloride: 104 mEq/L (ref 96–112)
Creatinine, Ser: 0.9 mg/dL (ref 0.50–1.35)
Potassium: 5 mEq/L (ref 3.5–5.1)
Sodium: 141 mEq/L (ref 135–145)

## 2012-09-06 LAB — CBC WITH DIFFERENTIAL/PLATELET
Basophils Absolute: 0 10*3/uL (ref 0.0–0.1)
Lymphocytes Relative: 24 % (ref 12–46)
Lymphs Abs: 2.7 10*3/uL (ref 0.7–4.0)
Neutro Abs: 7.8 10*3/uL — ABNORMAL HIGH (ref 1.7–7.7)
Platelets: 313 10*3/uL (ref 150–400)
RBC: 4.42 MIL/uL (ref 4.22–5.81)
RDW: 12.6 % (ref 11.5–15.5)
WBC: 11.2 10*3/uL — ABNORMAL HIGH (ref 4.0–10.5)

## 2012-09-06 MED ORDER — ENOXAPARIN SODIUM 100 MG/ML ~~LOC~~ SOLN
150.0000 mg | Freq: Once | SUBCUTANEOUS | Status: AC
  Administered 2012-09-06: 150 mg via SUBCUTANEOUS
  Filled 2012-09-06: qty 2

## 2012-09-06 NOTE — ED Notes (Signed)
Dr Knapp at bedside,  
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
## 2012-09-06 NOTE — Telephone Encounter (Signed)
Received call (2:12pm) from [facility], Angelica Chessman; states patient is there for wound check appointment; states there is significant swelling in the lower leg and foot today.  Please advise.  Their phone # is [phone]; also holding (line [phone]) if available to speak with now.
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
## 2012-09-06 NOTE — Progress Notes (Signed)
Physical Therapy - Wound Therapy  Treatment   Patient Details  Name: Brandon Day MRN: [id number] Date of Birth: [birth year]  Today's Date: 09/06/2012 Time: 4401-0272 Time Calculation (min): 30 min  Visit#: 10 of 14  Re-eval: 09/15/12 Charges: Selective debridement (= or < 20 cm)   Subjective Subjective Assessment Subjective: Pt reports increased swelling in LLE.  Pain Assessment Pain Assessment Pain Assessment: No/denies pain  Wound Therapy Wound 08/16/12  Thigh Left;Distal DEEP WOUND abcess (Active)  Site / Wound Assessment Red;Yellow 09/06/2012  4:37 PM  % Wound base Red or Granulating 100% 09/06/2012  4:37 PM  % Wound base Yellow 0% 09/06/2012  4:37 PM  % Wound base Black 0% 09/01/2012  6:17 PM  Peri-wound Assessment Edema;Erythema (blanchable) 09/06/2012  4:37 PM  Wound Length (cm) 2 cm 08/26/2012 12:19 PM  Wound Width (cm) 1.3 cm 08/26/2012 12:19 PM  Wound Depth (cm) 1.3 cm 08/26/2012 12:19 PM  Tunneling (cm) 4 09/06/2012  4:37 PM  Undermining (cm) 2.5 cm (was 3 cm) 08/26/2012 12:19 PM  Margins Attached edges (approximated) 09/06/2012  4:37 PM  Closure None 09/06/2012  4:37 PM  Drainage Amount Moderate 09/06/2012  4:37 PM  Drainage Description Odor;Sanguineous 09/06/2012  4:37 PM  Non-staged Wound Description Full thickness 09/06/2012  4:37 PM  Treatment Cleansed;Debridement (Selective);Hydrotherapy (Pulse lavage) 09/06/2012  4:37 PM  Dressing Type Moist to moist;Silver hydrofiber;ABD;Gauze (Comment) 09/06/2012  4:37 PM  Dressing Changed Changed 09/06/2012  4:37 PM  Dressing Status Clean;Dry 09/06/2012  4:37 PM     Wound 08/16/12 Laceration Thigh Left;Distal superficial wound (Active)  Site / Wound Assessment Clean;Red;Yellow 09/06/2012  4:37 PM  % Wound base Red or Granulating 100% 09/06/2012  4:37 PM  % Wound base Yellow 0% 09/06/2012  4:37 PM  % Wound base Black 0% 08/17/2012  2:34 PM  % Wound base Other (Comment) 0% 08/17/2012  2:34 PM  Peri-wound Assessment Intact 08/17/2012  2:34 PM   Wound Length (cm) 2.5 cm 08/16/2012  3:26 PM  Wound Width (cm) 0.8 cm 08/16/2012  3:26 PM  Wound Depth (cm) 0.1 cm 08/16/2012  3:26 PM  Margins Attached edges (approximated) 09/06/2012  4:37 PM  Drainage Amount Minimal 09/06/2012  4:37 PM  Treatment Cleansed 09/06/2012  4:37 PM  Dressing Type Moist to moist;Silver hydrofiber 09/02/2012  5:57 PM  Dressing Changed Changed 09/06/2012  4:37 PM  Dressing Status Clean 09/06/2012  4:37 PM     Incision 07/06/12 Knee Left (Active)   Hydrotherapy Pulsed lavage therapy - wound location: entire wound Pulsed Lavage with Suction (psi): 4 psi Pulsed Lavage with Suction - Normal Saline Used: 1000 mL Pulsed Lavage Tip: Tip with splash shield Selective Debridement Selective Debridement - Location: thigh area  Selective Debridement - Tools Used: Forceps;Other (comment) (qtips) Selective Debridement - Tissue Removed: slough and dry skin   Physical Therapy Assessment and Plan Wound Therapy - Assess/Plan/Recommendations Wound Therapy - Clinical Statement: Wound appears healthy and presents with decreased tunneling. Pt presents with significant swelling, redness, and heat in LLE. Dr. Romeo Apple was made aware of swelling and he recommended that pt go the emergency department. Pt was escorted to ED by therapist. Wound Plan: Await information on LLE from ED.  Problem List Patient Active Problem List   Diagnosis Date Noted  . Wound abscess 08/16/2012  . Seroma complicating a procedure 08/15/2012  . Postoperative wound infection 08/10/2012  . Hematoma 07/18/2012    Seth Bake, PTA  09/06/2012, 5:32 PM

## 2012-09-06 NOTE — ED Notes (Signed)
Pt states foot has already gone down & ready to go.

## 2012-09-07 ENCOUNTER — Ambulatory Visit (HOSPITAL_COMMUNITY)
Admit: 2012-09-07 | Discharge: 2012-09-07 | Disposition: A | Discharge status: Home / Self Care | Payer: Self-pay | Attending: Emergency Medicine | Admitting: Emergency Medicine

## 2012-09-07 ENCOUNTER — Ambulatory Visit (HOSPITAL_COMMUNITY)
Admission: RE | Admit: 2012-09-07 | Discharge: 2012-09-07 | Disposition: A | Discharge status: Home / Self Care | Payer: Self-pay | Source: Ambulatory Visit | Attending: Orthopedic Surgery | Admitting: Orthopedic Surgery

## 2012-09-07 DIAGNOSIS — M79609 Pain in unspecified limb: Secondary | ICD-10-CM | POA: Insufficient documentation

## 2012-09-07 DIAGNOSIS — M7989 Other specified soft tissue disorders: Secondary | ICD-10-CM | POA: Insufficient documentation
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
## 2012-09-08 ENCOUNTER — Ambulatory Visit (HOSPITAL_COMMUNITY)
Admission: RE | Admit: 2012-09-08 | Discharge: 2012-09-08 | Disposition: A | Discharge status: Home / Self Care | Payer: Self-pay | Source: Ambulatory Visit | Attending: Orthopedic Surgery | Admitting: Orthopedic Surgery

## 2012-09-08 NOTE — Progress Notes (Signed)
Physical Therapy - Wound Therapy  Treatment   Patient Details  Name: Brandon Day MRN: [id number] Date of Birth: 30-Dec-1985  Today's Date: 09/08/2012 Time: 4098-1191 Time Calculation (min): 26 min Charges: Selective debridement (= or < 20 cm)   Visit#: 12 of 14  Re-eval: 09/15/12  Subjective Subjective Assessment Subjective: Pt stated that he just played a round of golf so his left leg is swollen.  Pain Assessment Pain Assessment Pain Assessment: No/denies pain  Wound Therapy Wound 08/16/12  Thigh Left;Distal DEEP WOUND abcess (Active)  Site / Wound Assessment Red;Yellow 09/08/2012  2:26 PM  % Wound base Red or Granulating 100% 09/08/2012  2:26 PM  % Wound base Yellow 0% 09/08/2012  2:26 PM  % Wound base Black 0% 09/01/2012  6:17 PM  Peri-wound Assessment Edema;Erythema (blanchable) 09/08/2012  2:26 PM  Wound Length (cm) 2 cm 08/26/2012 12:19 PM  Wound Width (cm) 1.3 cm 08/26/2012 12:19 PM  Wound Depth (cm) 1.3 cm 08/26/2012 12:19 PM  Tunneling (cm) 3.7 09/08/2012  2:26 PM  Undermining (cm) 2.5 cm (was 3 cm) 08/26/2012 12:19 PM  Margins Attached edges (approximated) 09/08/2012  2:26 PM  Closure None 09/08/2012  2:26 PM  Drainage Amount Moderate 09/08/2012  2:26 PM  Drainage Description Odor;Sanguineous 09/08/2012  2:26 PM  Non-staged Wound Description Full thickness 09/08/2012  2:26 PM  Treatment Cleansed;Debridement (Selective) 09/08/2012  2:26 PM  Dressing Type Moist to moist;Silver hydrofiber;ABD;Gauze (Comment) 09/08/2012  2:26 PM  Dressing Changed New 09/08/2012  2:26 PM  Dressing Status Clean;Dry;Intact 09/08/2012  2:26 PM     Wound 08/16/12 Laceration Thigh Left;Distal superficial wound (Active)  Site / Wound Assessment Clean;Red;Yellow 09/06/2012  4:37 PM  % Wound base Red or Granulating 100% 09/06/2012  4:37 PM  % Wound base Yellow 0% 09/06/2012  4:37 PM  % Wound base Black 0% 08/17/2012  2:34 PM  % Wound base Other (Comment) 0% 08/17/2012  2:34 PM  Peri-wound Assessment Intact  08/17/2012  2:34 PM  Wound Length (cm) 2.5 cm 08/16/2012  3:26 PM  Wound Width (cm) 0.8 cm 08/16/2012  3:26 PM  Wound Depth (cm) 0.1 cm 08/16/2012  3:26 PM  Margins Attached edges (approximated) 09/06/2012  4:37 PM  Drainage Amount Minimal 09/06/2012  4:37 PM  Treatment Cleansed 09/06/2012  4:37 PM  Dressing Type Moist to moist;Silver hydrofiber 09/02/2012  5:57 PM  Dressing Changed Changed 09/06/2012  4:37 PM  Dressing Status Clean 09/06/2012  4:37 PM     Incision 07/06/12 Knee Left (Active)   Hydrotherapy Pulsed lavage therapy - wound location: entire wound Pulsed Lavage with Suction (psi): 4 psi Pulsed Lavage with Suction - Normal Saline Used: 1000 mL Pulsed Lavage Tip: Tip with splash shield Selective Debridement Selective Debridement - Location: thigh area  Selective Debridement - Tools Used: Forceps;Other (comment) (qtips) Selective Debridement - Tissue Removed: slough and dry skin   Physical Therapy Assessment and Plan Wound Therapy - Assess/Plan/Recommendations Wound Therapy - Clinical Statement: LLE less swollen this session. Coloring of leg is normal. Pt states that LLE doppler came back negative. Pt tolerates debridement well. Dry skin and periwound removed. Wound appears healthy overall. Pt tolerates wound care well.  Wound Plan: Continue with current POC per PT/MD.  Problem List Patient Active Problem List   Diagnosis Date Noted  . Wound abscess 08/16/2012  . Seroma complicating a procedure 08/15/2012  . Postoperative wound infection 08/10/2012  . Hematoma 07/18/2012    Seth Bake, PTA 09/08/2012, 3:05 PM

## 2012-09-09 ENCOUNTER — Ambulatory Visit (HOSPITAL_COMMUNITY)
Admission: RE | Admit: 2012-09-09 | Discharge: 2012-09-09 | Disposition: A | Discharge status: Home / Self Care | Payer: Self-pay | Source: Ambulatory Visit | Attending: Orthopedic Surgery | Admitting: Orthopedic Surgery

## 2012-09-09 NOTE — Progress Notes (Signed)
Physical Therapy - Wound Therapy  Treatment   Patient Details  Name: Brandon Day MRN: [id number] Date of Birth: [birth year]  Today's Date: 09/09/2012 Time: 1528-1600 Time Calculation (min): 32 min Charge: selective debridement <20 cm  Visit#: 13 of 14  Re-eval: 09/15/12  Subjective Subjective Assessment Subjective: No pain, dressing intact but have slid down LE some  Pain Assessment Pain Assessment Pain Assessment: No/denies pain  Wound Therapy  09/09/12 1606  Subjective Assessment  Subjective No pain, dressing intact but have slid down LE some  Wound  Date First Assessed: 08/16/12   Wound Type: (c)   Location: Thigh  Location Orientation: Left;Distal  Wound Description (Comments): DEEP WOUND abcess  Site / Wound Assessment Red;Yellow  % Wound base Red or Granulating 100%  % Wound base Yellow 0%  Peri-wound Assessment Edema;Erythema (blanchable)  Tunneling (cm) 3.7  Margins Attached edges (approximated)  Closure None  Drainage Amount Moderate  Drainage Description Odor;Sanguineous  Non-staged Wound Description Full thickness  Treatment Cleansed;Debridement (Selective);Hydrotherapy (Pulse lavage)  Dressing Type Moist to moist;Silver hydrofiber;ABD;Gauze (Comment) (vaseline periwound)  Dressing Changed New  Dressing Status Clean;Dry;Intact  Hydrotherapy  Pulsed lavage therapy - wound location entire wound  Pulsed Lavage with Suction (psi) 4 psi  Pulsed Lavage with Suction - Normal Saline Used 1000 mL  Pulsed Lavage Tip Tip with splash shield  Selective Debridement  Selective Debridement - Location thigh area   Selective Debridement - Tools Used Forceps (qtips)  Selective Debridement - Tissue Removed slough and dry skin  Wound Therapy - Assess/Plan/Recommendations  Wound Therapy - Clinical Statement Increased edema noted Lt LE following standing all day prior therapy today.  Wound continues to improve in granulation tissue with no signs of infection.  Continue PLS  with gentle packing, silver hydrofiber and ABD pad for drainage with gauze and ACE bandage.  No c/o pain through session.  Wound Plan Re-measure next session.  Continue with current POC per PT/MD.    Hydrotherapy Pulsed lavage therapy - wound location: entire wound Pulsed Lavage with Suction (psi): 4 psi Pulsed Lavage with Suction - Normal Saline Used: 1000 mL Pulsed Lavage Tip: Tip with splash shield Selective Debridement Selective Debridement - Location: thigh area  Selective Debridement - Tools Used: Forceps (qtips) Selective Debridement - Tissue Removed: slough and dry skin   Physical Therapy Assessment and Plan Wound Therapy - Assess/Plan/Recommendations Wound Therapy - Clinical Statement: Increased edema noted Lt LE following standing all day prior therapy today.  Wound continues to improve in granulation tissue with no signs of infection.  Continue PLS with gentle packing, silver hydrofiber and ABD pad for drainage with gauze and ACE bandage.  No c/o pain through session. Wound Plan: Re-measure next session.  Continue with current POC per PT/MD.      Goals    Problem List Patient Active Problem List   Diagnosis Date Noted  . Wound abscess 08/16/2012  . Seroma complicating a procedure 08/15/2012  . Postoperative wound infection 08/10/2012  . Hematoma 07/18/2012    GP    [name] 09/09/2012, 6:24 PM

## 2012-09-12 ENCOUNTER — Encounter: Payer: Self-pay | Admitting: Orthopedic Surgery

## 2012-09-12 ENCOUNTER — Ambulatory Visit (INDEPENDENT_AMBULATORY_CARE_PROVIDER_SITE_OTHER): Payer: Self-pay | Admitting: Orthopedic Surgery

## 2012-09-12 VITALS — BP 142/102 | Ht 72.0 in | Wt 258.0 lb

## 2012-09-12 DIAGNOSIS — Z5189 Encounter for other specified aftercare: Secondary | ICD-10-CM

## 2012-09-12 NOTE — Progress Notes (Signed)
Patient ID: Brandon Day, male   DOB: [birth year], 27 y.o.   MRN: [id number] Chief Complaint  Patient presents with  . Follow-up    Wound check DOS 07/06/12    Incision and drainage left leg palpated by dehiscence of the wound which is now closing. His granulation bed which is red with good granulation tissue. No signs of infection. No odor. Continue dressing changes return in 2 weeks

## 2012-09-13 ENCOUNTER — Ambulatory Visit (HOSPITAL_COMMUNITY)
Admission: RE | Admit: 2012-09-13 | Discharge: 2012-09-13 | Disposition: A | Discharge status: Home / Self Care | Payer: Self-pay | Source: Ambulatory Visit | Attending: Orthopedic Surgery | Admitting: Orthopedic Surgery

## 2012-09-13 DIAGNOSIS — L02419 Cutaneous abscess of limb, unspecified: Secondary | ICD-10-CM | POA: Insufficient documentation

## 2012-09-13 DIAGNOSIS — IMO0001 Reserved for inherently not codable concepts without codable children: Secondary | ICD-10-CM | POA: Insufficient documentation

## 2012-09-13 NOTE — Progress Notes (Signed)
Physical Therapy - Wound Therapy  Treatment   Patient Details  Name: Brandon Day MRN: [id number] Date of Birth: 05/11/1985  Today's Date: 09/13/2012 Time: 4098-1191 Time Calculation (min): 27 min Charge: PLS, Selective debridement <20 cm  Visit#: 14 of 14  Re-eval: 09/15/12  Subjective Subjective Assessment Subjective: No pain, dressings intact with no c/o.  Pain Assessment Pain Assessment Pain Assessment: No/denies pain  Wound Therapy  09/13/12 1632  Subjective Assessment  Subjective No pain, dressings intact with no c/o.  Wound  Date First Assessed: 08/16/12   Wound Type: (c)   Location: Thigh  Location Orientation: Left;Distal  Wound Description (Comments): DEEP WOUND abcess  Site / Wound Assessment Red;Granulation tissue  % Wound base Red or Granulating 100%  % Wound base Yellow 0%  Tunneling (cm) 3.2  Margins Attached edges (approximated)  Closure None  Drainage Amount Moderate  Drainage Description Odor;Sanguineous  Non-staged Wound Description Full thickness  Treatment Cleansed;Debridement (Selective);Hydrotherapy (Pulse lavage)  Dressing Type Moist to moist;Silver hydrofiber;ABD;Gauze (Comment) (vaseline periwound)  Dressing Changed New  Dressing Status Clean;Dry;Intact  Hydrotherapy  Pulsed lavage therapy - wound location entire wound  Pulsed Lavage with Suction (psi) 4 psi  Pulsed Lavage with Suction - Normal Saline Used 1000 mL  Pulsed Lavage Tip Tip with splash shield  Selective Debridement  Selective Debridement - Location thigh area   Selective Debridement - Tools Used Forceps (qtips)  Selective Debridement - Tissue Removed slough and dry skin  Wound Therapy - Assess/Plan/Recommendations  Wound Therapy - Clinical Statement Overall depth decreasing, 100% granulation tissue.  No signs of infections noted.  Continued packing with moist to moist dressings, vaseline applied periwound with ABD pad and gauze for drainage.  Wound Plan Re-measure next session;   Continue with current POC per PT/MD.    Hydrotherapy Pulsed lavage therapy - wound location: entire wound Pulsed Lavage with Suction (psi): 4 psi Pulsed Lavage with Suction - Normal Saline Used: 1000 mL Pulsed Lavage Tip: Tip with splash shield Selective Debridement Selective Debridement - Location: thigh area  Selective Debridement - Tools Used: Forceps (qtips) Selective Debridement - Tissue Removed: slough and dry skin   Physical Therapy Assessment and Plan Wound Therapy - Assess/Plan/Recommendations Wound Therapy - Clinical Statement: Overall depth decreasing, 100% granulation tissue.  No signs of infections noted.  Continued packing with moist to moist dressings, vaseline applied periwound with ABD pad and gauze for drainage. Wound Plan: Re-measure next session.  Continue with current POC per PT/MD.      Goals    Problem List Patient Active Problem List   Diagnosis Date Noted  . Wound abscess 08/16/2012  . Seroma complicating a procedure 08/15/2012  . Postoperative wound infection 08/10/2012  . Hematoma 07/18/2012    GP    Brandon Day 09/13/2012, 4:39 PM

## 2012-09-14 ENCOUNTER — Ambulatory Visit (HOSPITAL_COMMUNITY)
Admission: RE | Admit: 2012-09-14 | Discharge: 2012-09-14 | Disposition: A | Discharge status: Home / Self Care | Payer: Self-pay | Source: Ambulatory Visit | Attending: Orthopedic Surgery | Admitting: Orthopedic Surgery

## 2012-09-14 NOTE — Progress Notes (Signed)
Physical Therapy - Wound Therapy  Re-evaluation   Patient Details  Name: Brandon Day MRN: [id number] Date of Birth: 09/15/1985  Today's Date: 09/14/2012 Time: 4098-1191 Time Calculation (min): 25 min  Visit#: 15 of 26  Re-eval: 10/12/12 Charges:  Deb<20cm  Subjective Subjective Assessment Subjective: No pain, dressings intact with no c/o.   Wound Therapy Wound 08/16/12  Thigh Left;Distal DEEP WOUND abcess (Active)  Site / Wound Assessment Red;Granulation tissue 09/14/2012  5:00 PM  % Wound base Red or Granulating 100% 09/14/2012  5:00 PM  % Wound base Yellow 0% 09/14/2012  5:00 PM  % Wound base Black 0% 09/01/2012  6:17 PM  Peri-wound Assessment Edema;Erythema (blanchable) 09/09/2012  4:06 PM  Wound Length (cm) 1.5 cm (was 2 cm) 09/14/2012  5:00 PM  Wound Width (cm) 0.6 cm (was 1.3 cm) 09/14/2012  5:00 PM  Wound Depth (cm) 2 cm (was 3 cm) 09/14/2012  5:00 PM  Tunneling (cm)  09/14/2012  5:00 PM  Undermining (cm)  08/26/2012 12:19 PM  Margins unattached edges  09/14/2012  5:00 PM  Closure None 09/14/2012  5:00 PM  Drainage Amount Minimal 09/14/2012  5:00 PM  Drainage Description Serosanguineous;No odor 09/14/2012  5:00 PM  Non-staged Wound Description Full thickness 09/14/2012  5:00 PM  Treatment Hydrotherapy (Pulse lavage) 09/14/2012  5:00 PM  Dressing Type Moist to moist;Silver hydrofiber;ABD;Gauze (Comment) 09/14/2012  5:00 PM  Dressing Changed New 09/14/2012  5:00 PM  Dressing Status Clean;Dry;Intact 09/14/2012  5:00 PM     Hydrotherapy Pulsed lavage therapy - wound location: entire wound Pulsed Lavage with Suction (psi): 4 psi Pulsed Lavage with Suction - Normal Saline Used: 1000 mL Pulsed Lavage Tip: Tip with splash shield Selective Debridement Selective Debridement - Location: thigh area  Selective Debridement - Tools Used: Forceps (qtips) Selective Debridement - Tissue Removed: slough and dry skin   Physical Therapy Assessment and Plan Wound Therapy - Clinical Statement: Overall depth  decreasing, 100% granulation tissue.  No signs of infections noted.   Continued packing lightly with moist to moist dressings with added hydrogel due to adherence. Vaseline applied periwound. Wound Plan: Continue per MD orders; recommend woundcare 3X week to allow granulation tissue.      Goals Wound Therapy Goals - Improve the function of patient's integumentary system by progressing the wound(s) through the phases of wound healing by: Decrease Necrotic Tissue to: 0% - Progress: Met Increase Granulation Tissue to: 100% - Progress: Met Decrease Length/Width/Depth by (cm): depth by 1.4 and tunneling by 1.0 - Progress: Met Improve Drainage Characteristics: Min - Progress: Met Patient/Family will be able to : independently care for wound- Progress:  Progressing toward goals Time For Goal Achievement:  (1 month) Wound Therapy - Potential for Goals: Good  Problem List Patient Active Problem List   Diagnosis Date Noted  . Wound abscess 08/16/2012  . Seroma complicating a procedure 08/15/2012  . Postoperative wound infection 08/10/2012  . Hematoma 07/18/2012     Lurena Nida, PTA/CLT; Annett Fabian, MPT, ATC 09/14/2012, 5:28 PM

## 2012-09-15 ENCOUNTER — Ambulatory Visit (HOSPITAL_COMMUNITY)
Admission: RE | Admit: 2012-09-15 | Discharge: 2012-09-15 | Disposition: A | Discharge status: Home / Self Care | Payer: Self-pay | Source: Ambulatory Visit | Attending: Orthopedic Surgery | Admitting: Orthopedic Surgery

## 2012-09-15 NOTE — Progress Notes (Signed)
Physical Therapy - Wound Therapy  Treatment   Patient Details  Name: Brandon Day MRN: [id number] Date of Birth: June 05, 1985  Today's Date: 09/15/2012 Time: 4098-1191 Time Calculation (min): 30 min Charge: selective debridement <20 cm  Visit#: 16 of 26  Re-eval: 10/12/12  Subjective Subjective Assessment Subjective: No pain, dressings intact with no c/o.  Pain Assessment Pain Assessment Pain Assessment: No/denies pain  Wound Therapy  09/15/12 1639  Subjective Assessment  Subjective No pain, dressings intact with no c/o.  Wound  Date First Assessed: 08/16/12   Wound Type: (c)   Location: Thigh  Location Orientation: Left;Distal  Wound Description (Comments): DEEP WOUND abcess  Site / Wound Assessment Red;Granulation tissue  % Wound base Red or Granulating 100%  % Wound base Yellow 0%  Wound Depth (cm) 2 cm  Tunneling (cm) 3.2 11o'clock  Margins Attached edges (approximated)  Closure None  Drainage Amount Moderate  Drainage Description Serosanguineous;No odor  Non-staged Wound Description Full thickness  Treatment Cleansed;Debridement (Selective);Hydrotherapy (Pulse lavage)  Dressing Type Moist to moist;Silver hydrofiber;ABD;Gauze (Comment) (vaseline periwound)  Dressing Changed New  Dressing Status Clean;Dry;Intact  Hydrotherapy  Pulsed lavage therapy - wound location entire wound  Pulsed Lavage with Suction (psi) 4 psi  Pulsed Lavage with Suction - Normal Saline Used 1000 mL  Pulsed Lavage Tip Tip with splash shield  Selective Debridement  Selective Debridement - Location thigh area   Selective Debridement - Tools Used Forceps (qtips)  Selective Debridement - Tissue Removed slough and dry skin  Wound Therapy - Assess/Plan/Recommendations  Wound Therapy - Clinical Statement Noted tunneling 11 o'clock with increased depth than re-eval noted last session.  Wound 100% granulation, no reports of pain through session.  Continued packing with silver hydrofiber, ABD pad, and  compression wrapping with Ace bandage.   Wound Plan Continue per MD orders.    Hydrotherapy Pulsed lavage therapy - wound location: entire wound Pulsed Lavage with Suction (psi): 4 psi Pulsed Lavage with Suction - Normal Saline Used: 1000 mL Pulsed Lavage Tip: Tip with splash shield Selective Debridement Selective Debridement - Location: thigh area  Selective Debridement - Tools Used: Forceps (qtips) Selective Debridement - Tissue Removed: slough and dry skin   Physical Therapy Assessment and Plan Wound Therapy - Assess/Plan/Recommendations Wound Therapy - Clinical Statement: Noted tunneling 11 o'clock with increased depth than re-eval noted last session.  Wound 100% granulation, no reports of pain through session.  Continued packing with silver hydrofiber, ABD pad, and compression wrapping with Ace bandage.  Wound Plan: Continue per MD orders.      Goals    Problem List Patient Active Problem List   Diagnosis Date Noted  . Wound abscess 08/16/2012  . Seroma complicating a procedure 08/15/2012  . Postoperative wound infection 08/10/2012  . Hematoma 07/18/2012    GP    [name] 09/15/2012, 6:40 PM

## 2012-09-19 ENCOUNTER — Ambulatory Visit (HOSPITAL_COMMUNITY)
Admission: RE | Admit: 2012-09-19 | Discharge: 2012-09-19 | Disposition: A | Discharge status: Home / Self Care | Payer: Self-pay | Source: Ambulatory Visit | Attending: Orthopedic Surgery | Admitting: Orthopedic Surgery

## 2012-09-19 NOTE — Progress Notes (Signed)
Physical Therapy - Wound Therapy  Treatment   Patient Details  Name: Brandon Day MRN: [id number] Date of Birth: February 10, 1986  Today's Date: 09/19/2012 Time: 1610-1640 Time Calculation (min): 30 min Charges: Selective debridement (= or < 20 cm)   Visit#: 17 of 26  Re-eval: 10/12/12  Subjective Subjective Assessment Subjective: Pt states that dressing slid down some over the weekend.  Pain Assessment Pain Assessment Pain Assessment: No/denies pain  Wound Therapy Wound 08/16/12  Thigh Left;Distal DEEP WOUND abcess (Active)  Site / Wound Assessment Red;Granulation tissue 09/19/2012  5:53 PM  % Wound base Red or Granulating 100% 09/19/2012  5:53 PM  % Wound base Yellow 0% 09/19/2012  5:53 PM  % Wound base Black 0% 09/01/2012  6:17 PM  Peri-wound Assessment Edema;Erythema (blanchable) 09/09/2012  4:06 PM  Wound Length (cm) 1.5 cm 09/14/2012  5:00 PM  Wound Width (cm) 0.6 cm 09/14/2012  5:00 PM  Wound Depth (cm) 2 cm 09/15/2012  4:39 PM  Tunneling (cm) 3.0 cm 09/19/2012  5:53 PM  Undermining (cm) 2.5 cm (was 3 cm) 08/26/2012 12:19 PM  Margins Attached edges (approximated) 09/19/2012  5:53 PM  Closure None 09/19/2012  5:53 PM  Drainage Amount Moderate 09/19/2012  5:53 PM  Drainage Description Serosanguineous;No odor 09/19/2012  5:53 PM  Non-staged Wound Description Full thickness 09/19/2012  5:53 PM  Treatment Cleansed;Debridement (Selective);Hydrotherapy (Pulse lavage) 09/19/2012  5:53 PM  Dressing Type Moist to moist;ABD;Gauze (Comment) 09/19/2012  5:53 PM  Dressing Changed Changed 09/19/2012  5:53 PM  Dressing Status Clean;Dry;Intact 09/19/2012  5:53 PM     Wound 08/16/12 Laceration Thigh Left;Distal superficial wound (Active)  Site / Wound Assessment Clean;Red;Yellow 09/06/2012  4:37 PM  % Wound base Red or Granulating 100% 09/06/2012  4:37 PM  % Wound base Yellow 0% 09/06/2012  4:37 PM  % Wound base Black 0% 08/17/2012  2:34 PM  % Wound base Other (Comment) 0% 08/17/2012  2:34 PM  Peri-wound Assessment Intact  08/17/2012  2:34 PM  Wound Length (cm) 2.5 cm 08/16/2012  3:26 PM  Wound Width (cm) 0.8 cm 08/16/2012  3:26 PM  Wound Depth (cm) 0.1 cm 08/16/2012  3:26 PM  Margins Attached edges (approximated) 09/06/2012  4:37 PM  Drainage Amount Minimal 09/06/2012  4:37 PM  Treatment Cleansed 09/06/2012  4:37 PM  Dressing Type Moist to moist;Silver hydrofiber 09/02/2012  5:57 PM  Dressing Changed Changed 09/06/2012  4:37 PM  Dressing Status Clean 09/06/2012  4:37 PM     Incision 07/06/12 Knee Left (Active)   Hydrotherapy Pulsed lavage therapy - wound location: entire wound Pulsed Lavage with Suction (psi): 4 psi Pulsed Lavage with Suction - Normal Saline Used: 1000 mL Pulsed Lavage Tip: Tip with splash shield Selective Debridement Selective Debridement - Location: thigh area  Selective Debridement - Tools Used: Forceps (qtips) Selective Debridement - Tissue Removed: slough and dry skin   Physical Therapy Assessment and Plan Wound Therapy - Assess/Plan/Recommendations Wound Therapy - Clinical Statement: Tunneling has decreased .2cm since last session. Wound appears healthy and is without signs or sx of infection. Pt tolerates debridement well. Vaseline applied to periwound to protect skin integrity. Wound was packed with hydrogel and 2" gauze, covered with ABD pad and anchored with kerlex and ace bandage. Wound Plan: Continue wound care per PT POC/ MD orders.  Problem List Patient Active Problem List   Diagnosis Date Noted  . Wound abscess 08/16/2012  . Seroma complicating a procedure 08/15/2012  . Postoperative wound infection 08/10/2012  . Hematoma 07/18/2012  Seth Bake, PTA  09/19/2012, 5:58 PM

## 2012-09-20 ENCOUNTER — Ambulatory Visit (HOSPITAL_COMMUNITY)
Admission: RE | Admit: 2012-09-20 | Discharge: 2012-09-20 | Disposition: A | Discharge status: Home / Self Care | Payer: Self-pay | Source: Ambulatory Visit | Attending: Orthopedic Surgery | Admitting: Orthopedic Surgery

## 2012-09-20 NOTE — Progress Notes (Signed)
Physical Therapy - Wound Therapy  Treatment   Patient Details  Name: Chrisean Kloth MRN: [id number] Date of Birth: 10/11/1985  Today's Date: 09/20/2012 Time: 7829-5621 Time Calculation (min): 35 min Charges: Selective debridement (= or < 20 cm)   Visit#: 18 of 26  Re-eval: 10/12/12  Subjective Subjective Assessment Subjective: Pt is pain free and without complaint.  Pain Assessment Pain Assessment Pain Assessment: No/denies pain  Wound Therapy Wound 08/16/12  Thigh Left;Distal DEEP WOUND abcess (Active)  Site / Wound Assessment Red;Granulation tissue 09/20/2012  4:39 PM  % Wound base Red or Granulating 100% 09/20/2012  4:39 PM  % Wound base Yellow 0% 09/20/2012  4:39 PM  % Wound base Black 0% 09/01/2012  6:17 PM  Peri-wound Assessment Edema;Erythema (blanchable) 09/09/2012  4:06 PM  Wound Length (cm) 1.5 cm 09/14/2012  5:00 PM  Wound Width (cm) 0.6 cm 09/14/2012  5:00 PM  Wound Depth (cm) 2 cm 09/15/2012  4:39 PM  Tunneling (cm) 3.0 cm 09/19/2012  5:53 PM  Undermining (cm) 2.5 cm (was 3 cm) 08/26/2012 12:19 PM  Margins Attached edges (approximated) 09/20/2012  4:39 PM  Closure None 09/20/2012  4:39 PM  Drainage Amount Moderate 09/20/2012  4:39 PM  Drainage Description Serosanguineous;No odor 09/20/2012  4:39 PM  Non-staged Wound Description Full thickness 09/20/2012  4:39 PM  Treatment Cleansed;Debridement (Selective);Hydrotherapy ([place]) 09/20/2012  4:39 PM  Dressing Type Moist to moist;ABD;Gauze (Comment) 09/20/2012  4:39 PM  Dressing Changed Changed 09/20/2012  4:39 PM  Dressing Status Clean;Dry;Intact 09/20/2012  4:39 PM     Wound 08/16/12 Laceration Thigh Left;Distal superficial wound (Active)  Site / Wound Assessment Clean;Red;Yellow 09/06/2012  4:37 PM  % Wound base Red or Granulating 100% 09/06/2012  4:37 PM  % Wound base Yellow 0% 09/06/2012  4:37 PM  % Wound base Black 0% 08/17/2012  2:34 PM  % Wound base Other (Comment) 0% 08/17/2012  2:34 PM  Peri-wound Assessment Intact 08/17/2012  2:34 PM  Wound  Length (cm) 2.5 cm 08/16/2012  3:26 PM  Wound Width (cm) 0.8 cm 08/16/2012  3:26 PM  Wound Depth (cm) 0.1 cm 08/16/2012  3:26 PM  Margins Attached edges (approximated) 09/06/2012  4:37 PM  Drainage Amount Minimal 09/06/2012  4:37 PM  Treatment Cleansed 09/06/2012  4:37 PM  Dressing Type Moist to moist;Silver hydrofiber 09/02/2012  5:57 PM  Dressing Changed Changed 09/06/2012  4:37 PM  Dressing Status Clean 09/06/2012  4:37 PM     Incision 07/06/12 Knee Left (Active)   Hydrotherapy Pulsed lavage therapy - wound location: entire wound Pulsed Lavage with Suction (psi): 4 psi Pulsed Lavage with Suction - Normal Saline Used: 1000 mL Pulsed Lavage Tip: Tip with splash shield Selective Debridement Selective Debridement - Location: thigh area  Selective Debridement - Tools Used: Forceps (qtips) Selective Debridement - Tissue Removed: slough and dry skin   Physical Therapy Assessment and Plan Wound Therapy - Assess/Plan/Recommendations Wound Therapy - Clinical Statement: Wound continues to appear healthy. Pt tolerates debridement well. Less bleeding noted when packing was removed. Vaseline applied to periwound to protect skin integrity. Wound was packed with hydrogel and 2" gauze, covered with ABD pad and anchored with kerlex and ace bandage. Wound Plan: Continue wound care per PT POC/ MD orders.  Problem List Patient Active Problem List   Diagnosis Date Noted  . Wound abscess 08/16/2012  . Seroma complicating a procedure 08/15/2012  . Postoperative wound infection 08/10/2012  . Hematoma 07/18/2012    Seth Bake, PTA  09/20/2012, 4:43 PM

## 2012-09-21 ENCOUNTER — Ambulatory Visit (HOSPITAL_COMMUNITY)
Admission: RE | Admit: 2012-09-21 | Discharge: 2012-09-21 | Disposition: A | Discharge status: Home / Self Care | Payer: Self-pay | Source: Ambulatory Visit | Attending: *Deleted | Admitting: *Deleted

## 2012-09-21 NOTE — Progress Notes (Signed)
Physical Therapy - Wound Therapy  Treatment   Patient Details  Name: Brandon Day MRN: [id number] Date of Birth: May 13, 1985  Today's Date: 09/21/2012 Time: 4098-1191 Time Calculation (min): 30 min Charges: Selective debridement (= or < 20 cm)   Visit#: 19 of 26  Re-eval: 10/12/12  Subjective Subjective Assessment Subjective: Pt is without complaint.  Pain Assessment Pain Assessment Pain Assessment: No/denies pain  Wound Therapy Wound 08/16/12  Thigh Left;Distal DEEP WOUND abcess (Active)  Site / Wound Assessment Red;Granulation tissue 09/21/2012  5:52 PM  % Wound base Red or Granulating 100% 09/21/2012  5:52 PM  % Wound base Yellow 0% 09/21/2012  5:52 PM  % Wound base Black 0% 09/01/2012  6:17 PM  Peri-wound Assessment Edema;Erythema (blanchable) 09/09/2012  4:06 PM  Wound Length (cm) 1.5 cm 09/14/2012  5:00 PM  Wound Width (cm) 0.6 cm 09/14/2012  5:00 PM  Wound Depth (cm) 2 cm 09/15/2012  4:39 PM  Tunneling (cm) 2.6 cm 09/21/2012  5:52 PM  Undermining (cm) 2.5 cm (was 3 cm) 08/26/2012 12:19 PM  Margins Attached edges (approximated) 09/21/2012  5:52 PM  Closure None 09/21/2012  5:52 PM  Drainage Amount Moderate 09/21/2012  5:52 PM  Drainage Description Serosanguineous;No odor 09/21/2012  5:52 PM  Non-staged Wound Description Full thickness 09/21/2012  5:52 PM  Treatment Cleansed;Debridement (Selective);Hydrotherapy (Pulse lavage) 09/21/2012  5:52 PM  Dressing Type Moist to moist;ABD;Gauze (Comment);Hydrogel 09/21/2012  5:52 PM  Dressing Changed Changed 09/21/2012  5:52 PM  Dressing Status Clean;Dry;Intact 09/21/2012  5:52 PM     Wound 08/16/12 Laceration Thigh Left;Distal superficial wound (Active)  Site / Wound Assessment Clean;Red;Yellow 09/06/2012  4:37 PM  % Wound base Red or Granulating 100% 09/06/2012  4:37 PM  % Wound base Yellow 0% 09/06/2012  4:37 PM  % Wound base Black 0% 08/17/2012  2:34 PM  % Wound base Other (Comment) 0% 08/17/2012  2:34 PM  Peri-wound Assessment Intact 08/17/2012  2:34 PM  Wound  Length (cm) 2.5 cm 08/16/2012  3:26 PM  Wound Width (cm) 0.8 cm 08/16/2012  3:26 PM  Wound Depth (cm) 0.1 cm 08/16/2012  3:26 PM  Margins Attached edges (approximated) 09/06/2012  4:37 PM  Drainage Amount Minimal 09/06/2012  4:37 PM  Treatment Cleansed 09/06/2012  4:37 PM  Dressing Type Moist to moist;Silver hydrofiber 09/02/2012  5:57 PM  Dressing Changed Changed 09/06/2012  4:37 PM  Dressing Status Clean 09/06/2012  4:37 PM     Incision 07/06/12 Knee Left (Active)   Hydrotherapy Pulsed lavage therapy - wound location: entire wound Pulsed Lavage with Suction (psi): 4 psi Pulsed Lavage with Suction - Normal Saline Used: 1000 mL Pulsed Lavage Tip: Tip with splash shield Selective Debridement Selective Debridement - Location: thigh area  Selective Debridement - Tools Used: Forceps (qtips) Selective Debridement - Tissue Removed: slough and dry skin   Physical Therapy Assessment and Plan Wound Therapy - Assess/Plan/Recommendations Wound Therapy - Clinical Statement: Wound continues to appear healthy. Pt tolerates debridement well. Tunneling has decreased. Vaseline applied to periwound to protect skin integrity. Wound was packed with hydrogel and 2" gauze, covered with ABD pad and anchored with kerlex and ace bandage. Wound Plan: Continue wound care per PT POC/ MD orders.  Problem List Patient Active Problem List   Diagnosis Date Noted  . Wound abscess 08/16/2012  . Seroma complicating a procedure 08/15/2012  . Postoperative wound infection 08/10/2012  . Hematoma 07/18/2012    Seth Bake, PTA  09/21/2012, 5:57 PM

## 2012-09-22 ENCOUNTER — Ambulatory Visit (HOSPITAL_COMMUNITY)
Admission: RE | Admit: 2012-09-22 | Discharge: 2012-09-22 | Disposition: A | Discharge status: Home / Self Care | Payer: Self-pay | Source: Ambulatory Visit | Attending: Orthopedic Surgery | Admitting: Orthopedic Surgery

## 2012-09-22 NOTE — Progress Notes (Signed)
Physical Therapy - Wound Therapy  Treatment   Patient Details  Name: Brandon Day MRN: [id number] Date of Birth: 05/12/1985  Today's Date: 09/22/2012 Time: 4098-1191 Time Calculation (min): 33 min  Visit#: 20 of 26  Re-eval: 10/12/12  Subjective Subjective Assessment Subjective: No compliants, dressings intact  Pain Assessment Pain Assessment Pain Assessment: No/denies pain  Wound Therapy  09/22/12 1907  Subjective Assessment  Subjective No compliants, dressings intact  Wound  Date First Assessed: 08/16/12   Wound Type: (c)   Location: Thigh  Location Orientation: Left;Distal  Wound Description (Comments): DEEP WOUND abcess  Site / Wound Assessment Red;Granulation tissue  % Wound base Red or Granulating 100%  % Wound base Yellow 0%  Tunneling (cm) 2.4  Margins Attached edges (approximated)  Closure None  Drainage Amount Moderate  Drainage Description Serosanguineous;No odor  Non-staged Wound Description Full thickness  Treatment Cleansed;Debridement (Selective);Hydrotherapy (Pulse lavage)  Dressing Type Moist to moist;ABD;Gauze (Comment);Hydrogel (vaseline periwound)  Dressing Changed Changed  Dressing Status Clean;Dry;Intact     Physical Therapy Assessment and Plan Wound Therapy - Assess/Plan/Recommendations Wound Therapy - Clinical Statement: Wound continues to improved in decreased depth.  Wound 100% granulation and no signe/symptoms of infections.  Wound packed with moist to moist with hydrogel, 2"gauze, covered with 4x4 and ABD page for drainage and ace bandage. Wound Plan: Continue wound care per PT POC/ MD orders.      Goals    Problem List Patient Active Problem List   Diagnosis Date Noted  . Wound abscess 08/16/2012  . Seroma complicating a procedure 08/15/2012  . Postoperative wound infection 08/10/2012  . Hematoma 07/18/2012    GP    [name] 09/22/2012, 7:10 PM

## 2012-09-23 ENCOUNTER — Ambulatory Visit (HOSPITAL_COMMUNITY)
Admission: RE | Admit: 2012-09-23 | Discharge: 2012-09-23 | Disposition: A | Discharge status: Home / Self Care | Payer: Self-pay | Source: Ambulatory Visit

## 2012-09-23 NOTE — Progress Notes (Signed)
Physical Therapy - Wound Therapy  Treatment   Patient Details  Name: Brandon Day MRN: [id number] Date of Birth: [birth year]  Today's Date: 09/23/2012 Time: 6213-0865 Time Calculation (min): 32 min Charge: selective debridement < 20 cm  Visit#: 21 of 26  Re-eval: 10/12/12  Subjective Subjective Assessment Subjective: No complaints, dressings have slid down leg still intact on leg.  Pain Assessment Pain Assessment Pain Assessment: No/denies pain  Wound Therapy  09/23/12 1700  Wound  Date First Assessed: 08/16/12   Wound Type: (c)   Location: Thigh  Location Orientation: Left;Distal  Wound Description (Comments): DEEP WOUND abcess  Site / Wound Assessment Red;Granulation tissue  % Wound base Red or Granulating 100%  % Wound base Yellow 0%  Wound Length (cm) 1.3 cm  Wound Width (cm) 0.5 cm  Wound Depth (cm) 1.5 cm  Tunneling (cm) 2.4  Margins Attached edges (approximated)  Closure None  Drainage Amount Minimal  Drainage Description Serosanguineous;No odor  Non-staged Wound Description Full thickness  Treatment Cleansed;Debridement (Selective);Hydrotherapy (Pulse lavage)  Dressing Type Moist to moist;ABD;Gauze (Comment);Hydrogel (vaseline periwound)  Dressing Changed Changed  Dressing Status Clean;Dry;Intact  Hydrotherapy  Pulsed lavage therapy - wound location entire wound  Pulsed Lavage with Suction (psi) 4 psi  Pulsed Lavage with Suction - Normal Saline Used 1000 mL  Pulsed Lavage Tip Tip with splash shield  Selective Debridement  Selective Debridement - Location thigh area   Selective Debridement - Tools Used Forceps;Other (comment) (qtips)  Selective Debridement - Tissue Removed slough and dry skin  Wound Therapy - Assess/Plan/Recommendations  Wound Therapy - Clinical Statement Wound continues to improve in decreased depth and overall drainage.  Continued with PLS and gentle moist to moist packing with saline and hydrogel, vaseline applied periwound with dry 4x4  and ABD pad for drainage; wrapped in gauze and ACE bandage.  Wound Plan Continue wound care per PT POC/ MD orders.    Hydrotherapy Pulsed lavage therapy - wound location: entire wound Pulsed Lavage with Suction (psi): 4 psi Pulsed Lavage with Suction - Normal Saline Used: 1000 mL Pulsed Lavage Tip: Tip with splash shield Selective Debridement Selective Debridement - Location: thigh area  Selective Debridement - Tools Used: Forceps;Other (comment) (qtips) Selective Debridement - Tissue Removed: slough and dry skin   Physical Therapy Assessment and Plan Wound Therapy - Assess/Plan/Recommendations Wound Therapy - Clinical Statement: Wound continues to improve in decreased depth and overall drainage.  Continued with PLS and gentle moist to moist packing with saline and hydrogel, vaseline applied periwound with dry 4x4 and ABD pad for drainage; wrapped in gauze and ACE bandage. Wound Plan: Continue wound care per PT POC/ MD orders.      Goals    Problem List Patient Active Problem List   Diagnosis Date Noted  . Wound abscess 08/16/2012  . Seroma complicating a procedure 08/15/2012  . Postoperative wound infection 08/10/2012  . Hematoma 07/18/2012    GP    [name] 09/23/2012, 5:29 PM

## 2012-09-26 ENCOUNTER — Ambulatory Visit (INDEPENDENT_AMBULATORY_CARE_PROVIDER_SITE_OTHER): Payer: Self-pay | Admitting: Orthopedic Surgery

## 2012-09-26 VITALS — BP 139/84 | Ht 72.0 in | Wt 258.0 lb

## 2012-09-26 DIAGNOSIS — Z5189 Encounter for other specified aftercare: Secondary | ICD-10-CM

## 2012-09-26 NOTE — Patient Instructions (Addendum)
Change dressing daily

## 2012-09-26 NOTE — Progress Notes (Signed)
Patient ID: Brandon Day, male   DOB: 12/03/1985, 27 y.o.   MRN: [id number] Chief Complaint  Patient presents with  . Follow-up    2 week recheck on left leg wound. DOS 07-06-12.     BP 139/84  Ht 6' (1.829 m)  Wt 258 lb (117.028 kg)  BMI 34.98 kg/m2  WOUND ALMOST CLOSED  NO ERYTHEMA  CHANGE DAILY   FU 4 WEEKS

## 2012-09-27 ENCOUNTER — Ambulatory Visit (HOSPITAL_COMMUNITY): Payer: Self-pay | Admitting: *Deleted

## 2012-09-28 ENCOUNTER — Ambulatory Visit (HOSPITAL_COMMUNITY): Payer: Self-pay | Admitting: *Deleted

## 2012-09-29 ENCOUNTER — Ambulatory Visit (HOSPITAL_COMMUNITY): Payer: Self-pay | Admitting: *Deleted

## 2012-09-30 ENCOUNTER — Ambulatory Visit (HOSPITAL_COMMUNITY): Payer: Self-pay

## 2012-10-19 ENCOUNTER — Encounter (HOSPITAL_COMMUNITY): Payer: Self-pay

## 2012-10-19 ENCOUNTER — Emergency Department (HOSPITAL_COMMUNITY)
Admission: EM | Admit: 2012-10-19 | Discharge: 2012-10-19 | Disposition: A | Discharge status: Home / Self Care | Payer: Self-pay | Attending: Emergency Medicine | Admitting: Emergency Medicine

## 2012-10-19 DIAGNOSIS — L02416 Cutaneous abscess of left lower limb: Secondary | ICD-10-CM

## 2012-10-19 DIAGNOSIS — F17200 Nicotine dependence, unspecified, uncomplicated: Secondary | ICD-10-CM | POA: Insufficient documentation

## 2012-10-19 DIAGNOSIS — M629 Disorder of muscle, unspecified: Secondary | ICD-10-CM | POA: Insufficient documentation

## 2012-10-19 DIAGNOSIS — L02419 Cutaneous abscess of limb, unspecified: Secondary | ICD-10-CM | POA: Insufficient documentation

## 2012-10-19 DIAGNOSIS — M2420 Disorder of ligament, unspecified site: Secondary | ICD-10-CM | POA: Insufficient documentation

## 2012-10-19 DIAGNOSIS — Z9889 Other specified postprocedural states: Secondary | ICD-10-CM | POA: Insufficient documentation

## 2012-10-19 DIAGNOSIS — L03119 Cellulitis of unspecified part of limb: Secondary | ICD-10-CM | POA: Insufficient documentation

## 2012-10-19 MED ORDER — IBUPROFEN 600 MG PO TABS: 600.0000 mg | ORAL_TABLET | Freq: Four times a day (QID) | ORAL | Status: DC | PRN

## 2012-10-19 MED ORDER — LIDOCAINE HCL (PF) 2 % IJ SOLN
10.0000 mL | Freq: Once | INTRAMUSCULAR | Status: AC
  Administered 2012-10-19: 10 mL
  Filled 2012-10-19: qty 10

## 2012-10-19 MED ORDER — SULFAMETHOXAZOLE-TRIMETHOPRIM 800-160 MG PO TABS: 1.0000 | ORAL_TABLET | Freq: Two times a day (BID) | ORAL | Status: DC

## 2012-10-19 NOTE — ED Notes (Signed)
Pt with abscess to left lateral lower leg, first noted 2 days ago, swelling, redness and warm to touch at site with dark center, denies drainage

## 2012-10-19 NOTE — ED Notes (Signed)
Pt reports abscess to left lower leg x 3 or 4 days.  Area around abscess red.

## 2012-10-22 NOTE — ED Provider Notes (Signed)
CSN: [id number]     Arrival date & time 10/19/12  1112 History     First MD Initiated Contact with Patient 10/19/12 1122     Chief Complaint  Patient presents with  . Abscess   (Consider location/radiation/quality/duration/timing/severity/associated sxs/prior Treatment) Patient is a 27 y.o. male presenting with abscess. The history is provided by the patient.  Abscess Location:  Leg Leg abscess location:  L lower leg Size:  3 cm Abscess quality: fluctuance, induration, painful, redness and warmth   Abscess quality: not draining and not weeping   Red streaking: no   Duration:  2 days Progression:  Worsening Pain details:    Quality:  Throbbing and shooting   Severity:  Moderate   Timing:  Constant   Progression:  Worsening Chronicity:  New Context: not diabetes   Context comment:  Unsure of inciting event,  he suspects insect bite.  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 No pertinent past medical history. Past Surgical History  Procedure Laterality Date  . Femur fracture surgery    . Foot surgery Right 2011  . Fracture surgery    . Wrist fracture surgery Left 1999  . Wisdom tooth extraction N/A     all 4   wisdom teeth extracted  . Hematoma evacuation Left 07/06/2012    Procedure: EVACUATION HEMATOMA;  Surgeon: Vickki Hearing, MD;  Location: [facility];  Service: Orthopedics;  Laterality: Left;  left thigh    No family history on file. History  Substance Use Topics  . Smoking status: Light Tobacco Smoker  . Smokeless tobacco: Not on file  . Alcohol Use: Yes     Comment: rearely,  beer or liquor    Review of Systems  Constitutional: Negative for fever, chills and fatigue.  HENT: Negative for facial swelling.   Respiratory: Negative for  shortness of breath and wheezing.   Gastrointestinal: Negative for nausea.  Skin: Positive for color change and wound.  Neurological: Negative for numbness and headaches.    Allergies  Benadryl  Home Medications   Current Outpatient Rx  Name  Route  Sig  Dispense  Refill  . ibuprofen (ADVIL,MOTRIN) 600 MG tablet   Oral   Take 1 tablet (600 mg total) by mouth every 6 (six) hours as needed for pain.   30 tablet   0   . sulfamethoxazole-trimethoprim (SEPTRA DS) 800-160 MG per tablet   Oral   Take 1 tablet by mouth every 12 (twelve) hours.   20 tablet   0    BP 136/92  Pulse 96  Temp(Src) 98.1 F (36.7 C) (Oral)  Resp 20  Ht 6' (1.829 m)  Wt 260 lb (117.935 kg)  BMI 35.25 kg/m2  SpO2 99% Physical Exam  Constitutional: He appears well-developed and well-nourished. No distress.  HENT:  Head: Normocephalic.  Neck: Neck supple.  Cardiovascular: Normal rate.   Pulmonary/Chest: No respiratory distress. He has no wheezes.  Musculoskeletal: Normal range of motion. He exhibits no edema.  Skin:  3 cm induration left posterior mid calf with a 3 cm surrounding erythema, non draining.  Central fluctuant dark punctum, not ulcerated.    ED Course   Procedures (including critical care time)  INCISION AND DRAINAGE Performed by: Burgess Amor Consent: Verbal consent obtained. Risks  and benefits: risks, benefits and alternatives were discussed Type: abscess  Body area: left posterior calf  Anesthesia: local infiltration  Incision was made with a scalpel.  Local anesthetic: lidocaine 2% without epinephrine  Anesthetic total: 10ml  (3 ml injected around the site,  Remaining 7 to flush the opened abscess)  Complexity: complex Blunt dissection to break up loculations  Drainage: purulent  Drainage amount: moderate  Packing material: no packing  Patient tolerance: Patient tolerated the procedure well with no immediate complications.     Labs Reviewed - No data to  display No results found. 1. Abscess of leg, left     MDM  Pt encouraged warm water soaks tid,  Prescribed ibuprofen, bactrim.  Return here for any worsening sx for recheck, pt understands and agrees with plan.  Encouraged to keep clean and covered.  Burgess Amor, PA-C 10/22/12 [phone]

## 2012-10-24 ENCOUNTER — Ambulatory Visit (INDEPENDENT_AMBULATORY_CARE_PROVIDER_SITE_OTHER): Payer: Self-pay | Admitting: Orthopedic Surgery

## 2012-10-24 VITALS — Ht 72.0 in | Wt 260.0 lb

## 2012-10-24 DIAGNOSIS — Z5189 Encounter for other specified aftercare: Secondary | ICD-10-CM

## 2012-10-24 NOTE — Progress Notes (Signed)
Patient ID: Brandon [name], male   DOB: 08/02/1985, 27 y.o.   MRN: [id number] Chief Complaint  Patient presents with  . Follow-up    4 week recheck on left leg. DOS 07-06-12.    Ht 6' (1.829 m)  Wt 260 lb (117.935 kg)  BMI 35.25 kg/m2  The surgical site is cleared up but he has a new wound on the left posterior calf he's on Septra he went to the ER he had a drainage there was purulent drainage. He still has some erythema they are  He will finish these antibiotics and then call us if things don't clear up otherwise she is discharged

## 2012-10-24 NOTE — Patient Instructions (Addendum)
Finish antibiotic   Return if it doesn't clear up

## 2012-10-31 NOTE — ED Provider Notes (Signed)
Medical screening examination/treatment/procedure(s) were performed by non-physician practitioner and as supervising physician I was immediately available for consultation/collaboration.   Kaiven Vester J. Joclyn Alsobrook, MD 10/31/12 2328 

## 2014-01-10 IMAGING — US US EXTREM LOW VENOUS*L*
1 series · 14 of 24 positions shown · non-contrast
Comparison: None.

CLINICAL DATA: Left leg pain and swelling

LEFT LOWER EXTREMITY VENOUS DUPLEX ULTRASOUND
TECHNIQUE: Gray-scale sonography with graded compression, as well
as color Doppler and duplex ultrasound were performed to evaluate
the deep venous system of the lower extremity from the level of the
common femoral vein through the popliteal and proximal calf veins.
Spectral Doppler was utilized to evaluate flow at rest and with
distal augmentation maneuvers.

[Series 1: us extrem low venous*left* · 0.10mm/px · 14 of 32 slices shown]
[im 1/32]
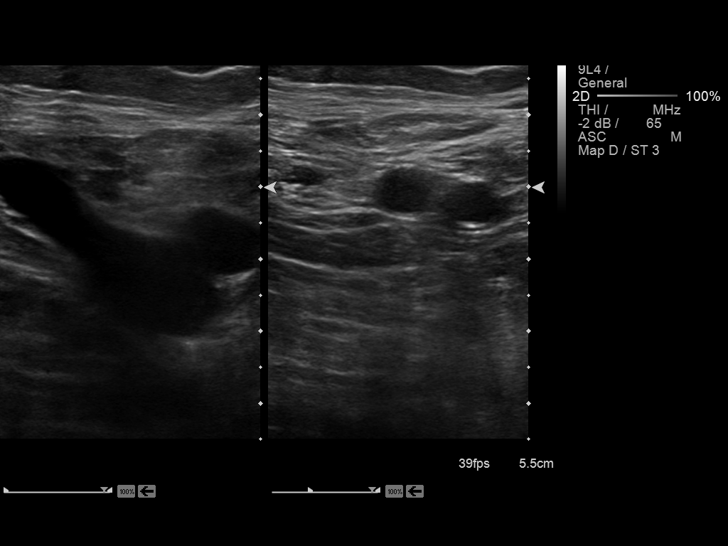
[im 3/32]
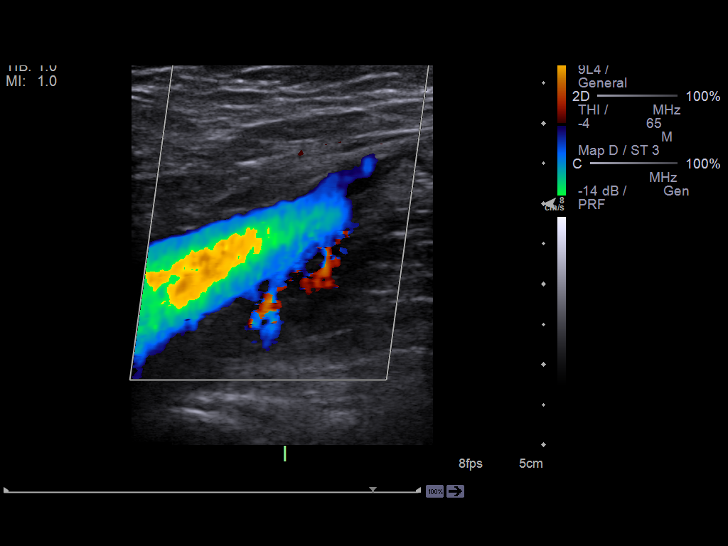
[im 6/32]
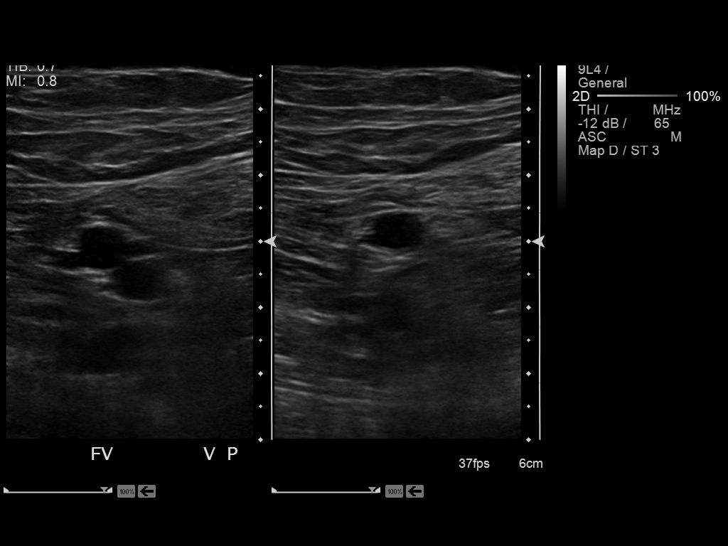
[im 9/32]
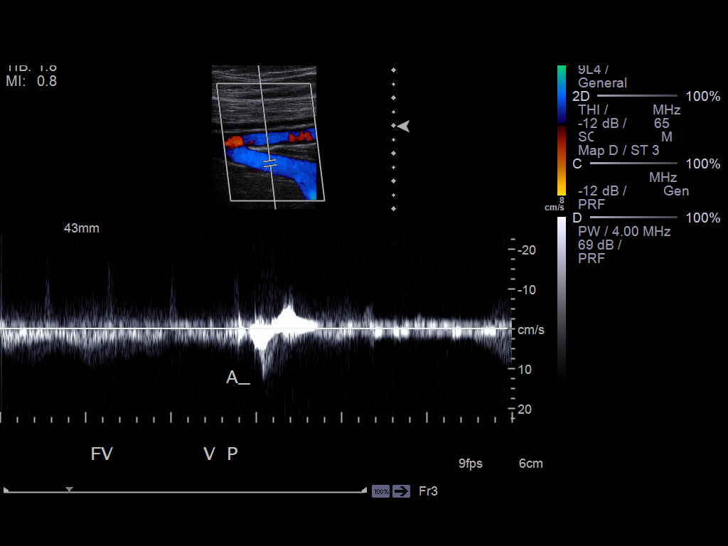
[im 10/32]
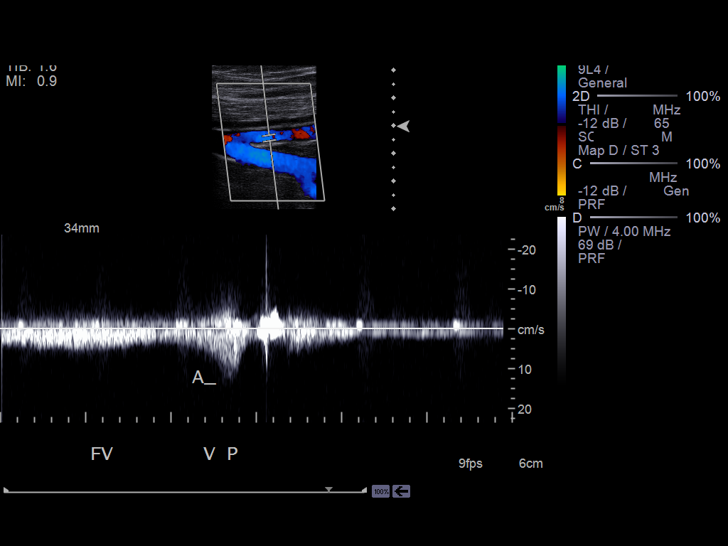
[im 13/32]
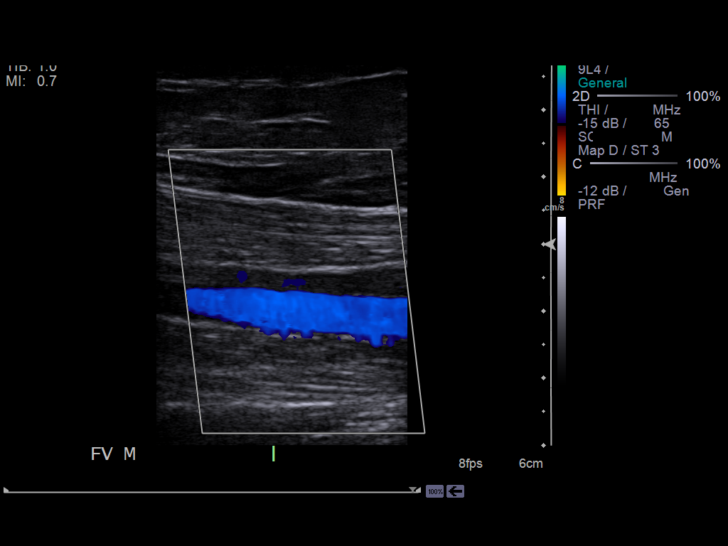
[im 15/32]
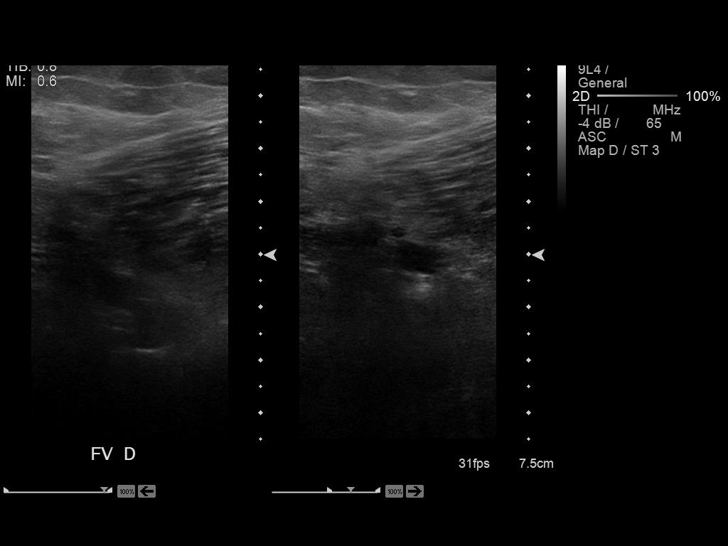
[im 17/32]
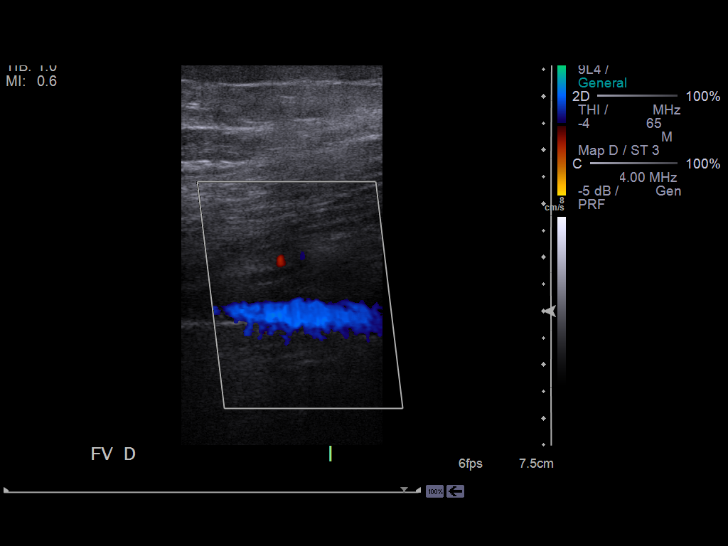
[im 19/32]
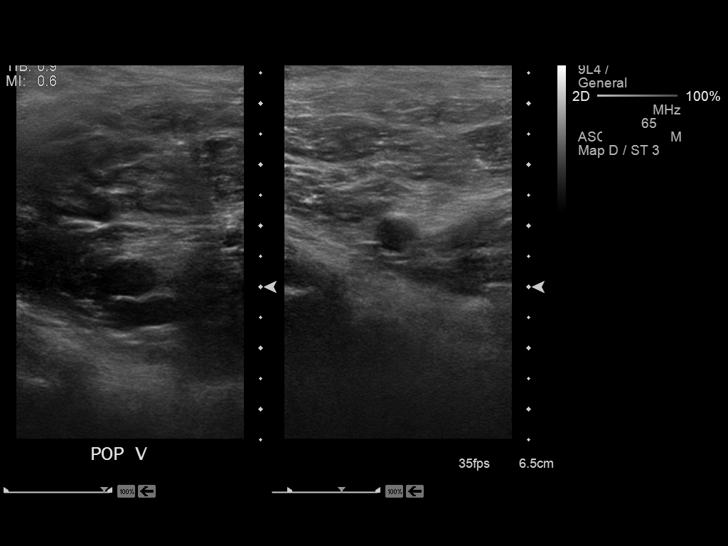
[im 22/32]
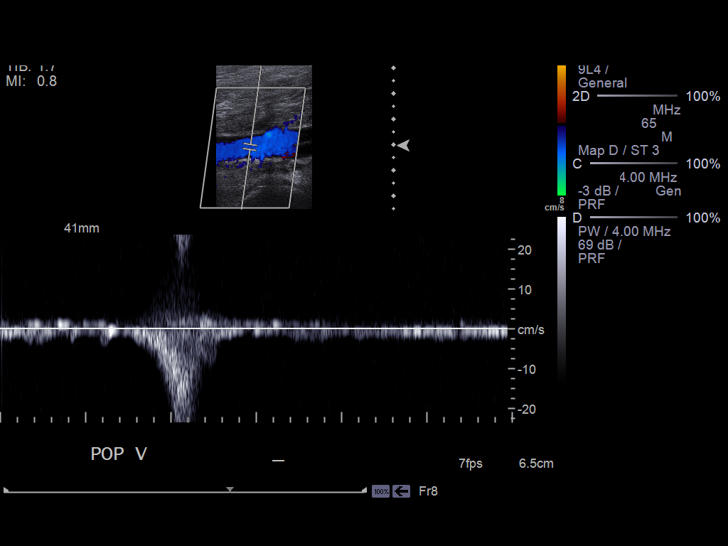
[im 25/32]
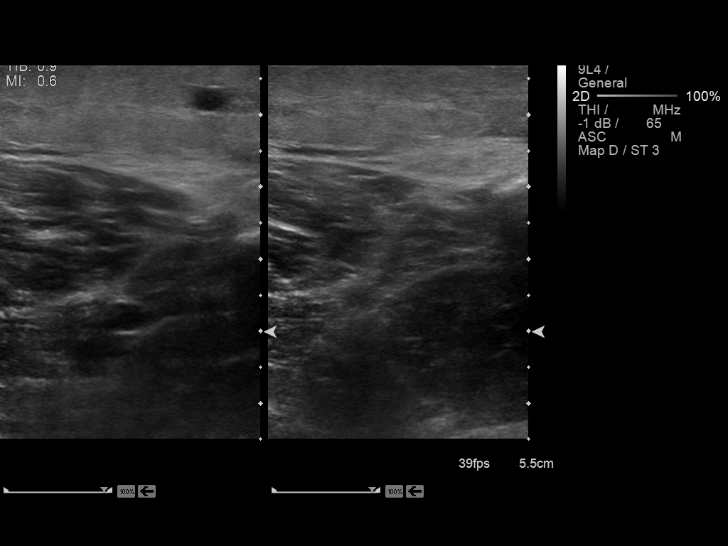
[im 26/32]
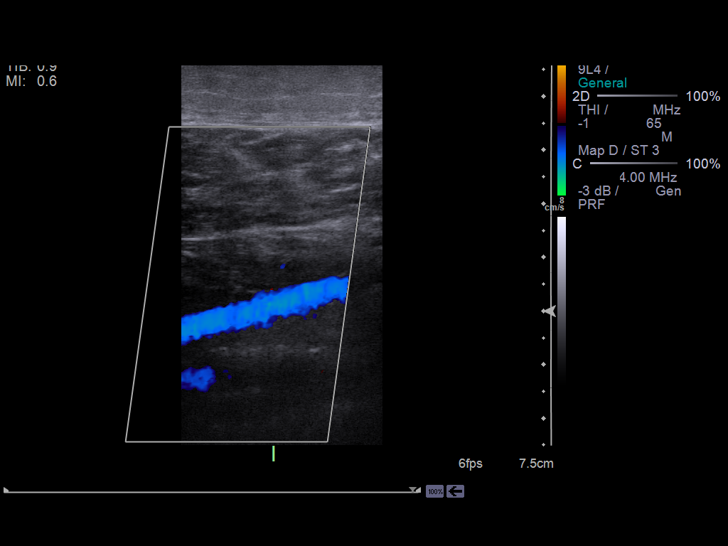
[im 29/32]
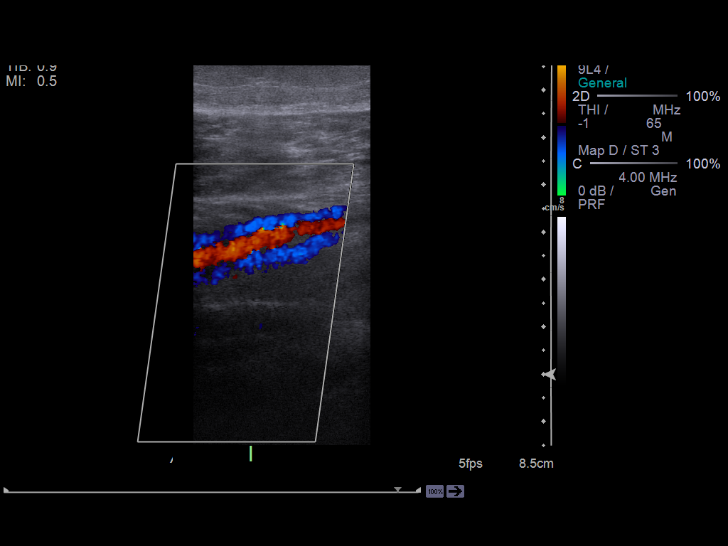
[im 32/32]
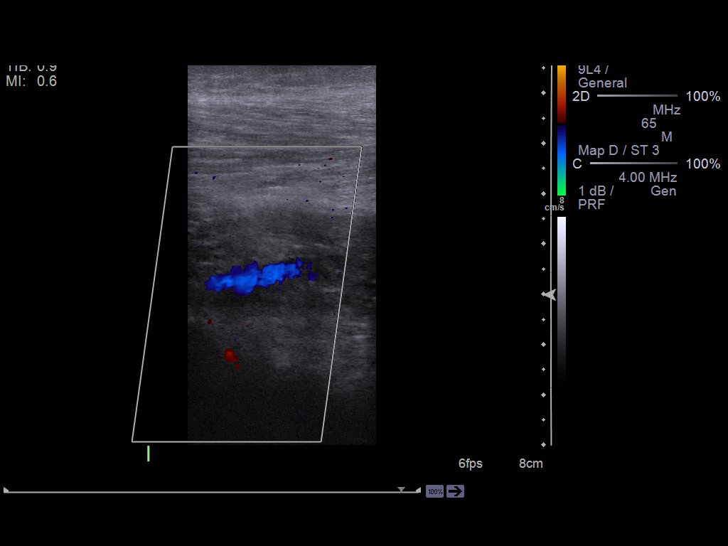

[14 of 24 positions shown; findings below may reference images not displayed]

FINDINGS: Normal compressibility of the common femoral,
superficial femoral, and popliteal veins is demonstrated, as well
as the visualized proximal calf veins.  No filling defects to
suggest DVT on grayscale or color Doppler imaging.  Doppler
waveforms show normal direction of venous flow, normal respiratory
phasicity and response to augmentation.
IMPRESSION: No evidence of lower extremity deep vein thrombosis.

## 2016-10-07 ENCOUNTER — Encounter (HOSPITAL_COMMUNITY): Payer: Self-pay | Admitting: *Deleted

## 2016-10-07 ENCOUNTER — Emergency Department (HOSPITAL_COMMUNITY)
Admission: EM | Admit: 2016-10-07 | Discharge: 2016-10-07 | Disposition: A | Discharge status: Home / Self Care | Payer: Self-pay | Attending: Emergency Medicine | Admitting: Emergency Medicine

## 2016-10-07 ENCOUNTER — Emergency Department (HOSPITAL_COMMUNITY): Payer: Self-pay

## 2016-10-07 DIAGNOSIS — S93411A Sprain of calcaneofibular ligament of right ankle, initial encounter: Secondary | ICD-10-CM

## 2016-10-07 DIAGNOSIS — W010XXA Fall on same level from slipping, tripping and stumbling without subsequent striking against object, initial encounter: Secondary | ICD-10-CM | POA: Insufficient documentation

## 2016-10-07 DIAGNOSIS — S82839A Other fracture of upper and lower end of unspecified fibula, initial encounter for closed fracture: Secondary | ICD-10-CM

## 2016-10-07 DIAGNOSIS — Y929 Unspecified place or not applicable: Secondary | ICD-10-CM | POA: Insufficient documentation

## 2016-10-07 DIAGNOSIS — Y990 Civilian activity done for income or pay: Secondary | ICD-10-CM | POA: Insufficient documentation

## 2016-10-07 DIAGNOSIS — S8265XA Nondisplaced fracture of lateral malleolus of left fibula, initial encounter for closed fracture: Secondary | ICD-10-CM | POA: Insufficient documentation

## 2016-10-07 DIAGNOSIS — S93412A Sprain of calcaneofibular ligament of left ankle, initial encounter: Secondary | ICD-10-CM | POA: Insufficient documentation

## 2016-10-07 DIAGNOSIS — Y939 Activity, unspecified: Secondary | ICD-10-CM | POA: Insufficient documentation

## 2016-10-07 MED ORDER — KETOROLAC TROMETHAMINE 60 MG/2ML IM SOLN: 60.0000 mg | Freq: Once | INTRAMUSCULAR | Status: DC

## 2016-10-07 MED ORDER — IBUPROFEN 800 MG PO TABS: 800.0000 mg | ORAL_TABLET | Freq: Three times a day (TID) | ORAL | 0 refills | Status: DC

## 2016-10-07 MED ORDER — IBUPROFEN 800 MG PO TABS
800.0000 mg | ORAL_TABLET | Freq: Once | ORAL | Status: AC
  Administered 2016-10-07: 800 mg via ORAL
  Filled 2016-10-07: qty 1

## 2016-10-07 NOTE — ED Provider Notes (Signed)
[facility] Provider Note   CSN: [id number] Arrival date & time: 10/07/16  2135     History   Chief Complaint Chief Complaint  Patient presents with  . Ankle Pain    HPI Lyda KalataJoshua Maffei is a 31 y.o. 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  Pain is aching, constant and worse with palpation, movement and weight bearing.  The patient was able to weight bear immediately after the event.  There is no radiation of pain and the patient denies numbness distal to the injury site, but states his foot feels cool.  The patients treatment prior to arrival included oxycodone and ibuprofen. .  The history is provided by the patient.    History reviewed. No pertinent past medical history.  Patient Active Problem List   Diagnosis Date Noted  . Wound abscess 08/16/2012  . Seroma complicating a procedure 08/15/2012  . Postoperative wound infection 08/10/2012  . Hematoma 07/18/2012    Past Surgical History:  Procedure Laterality Date  . FEMUR FRACTURE SURGERY    . FOOT SURGERY Right 2011  . FRACTURE SURGERY    . HEMATOMA EVACUATION Left 07/06/2012   Procedure: EVACUATION HEMATOMA;  Surgeon: Vickki HearingStanley E Harrison, MD;  Location: [facility];  Service: Orthopedics;  Laterality: Left;  left thigh   . WISDOM TOOTH EXTRACTION N/A    all 4   wisdom teeth extracted  . WRIST FRACTURE SURGERY Left 1999       Home Medications    Prior to Admission medications   Medication Sig Start Date End Date Taking? Authorizing Provider  oxyCODONE-acetaminophen (PERCOCET/ROXICET) 5-325 MG tablet Take 1 tablet by mouth once.   Yes [provider]  ibuprofen (ADVIL,MOTRIN) 800 MG tablet Take 1 tablet (800 mg total) by mouth 3 (three) times daily. 10/07/16   Burgess AmorIdol, Abbygale Lapid, PA-C    Family History No family history on file.  Social History Social History  Substance Use Topics  . Smoking  status: Light Tobacco Smoker  . Smokeless tobacco: Current User  . Alcohol use Yes     Comment: rearely,  beer or liquor     Allergies   Benadryl [diphenhydramine hcl]   Review of Systems Review of Systems  Musculoskeletal: Positive for arthralgias and joint swelling.  Skin: Negative for wound.  Neurological: Negative for weakness and numbness.     Physical Exam Updated Vital Signs BP (!) 159/90   Pulse 82   Temp 98.3 F (36.8 C) (Oral)   Resp 20   Ht 6' (1.829 m)   Wt 127 kg (280 lb)   SpO2 98%   BMI 37.97 kg/m   Physical Exam  Constitutional: He appears well-developed and well-nourished.  HENT:  Head: Normocephalic.  Cardiovascular: Normal rate and intact distal pulses.  Exam reveals no decreased pulses.   Pulses:      Dorsalis pedis pulses are 2+ on the right side, and 2+ on the left side.       Posterior tibial pulses are 2+ on the right side, and 2+ on the left side.  Musculoskeletal: He exhibits edema and tenderness.       Right ankle: He exhibits decreased range of motion and swelling. He exhibits no ecchymosis and normal pulse. Tenderness. Lateral malleolus tenderness found. No head of 5th metatarsal and no proximal fibula tenderness found. Achilles tendon normal.  Neurological: He is alert. No sensory deficit.  Skin:  Skin is warm, dry and intact. Capillary refill takes less than 2 seconds.  Dorsalis pedis pulse is full.  Skin of foot and ankle warm.   Nursing note and vitals reviewed.    ED Treatments / Results  Labs (all labs ordered are listed, but only abnormal results are displayed) Labs Reviewed - No data to display  EKG  EKG Interpretation None       Radiology Dg Ankle Complete Right  Result Date: 10/07/2016 CLINICAL DATA:  Twisting injury with pain EXAM: RIGHT ANKLE - COMPLETE 3+ VIEW COMPARISON:  None. FINDINGS: Moderate lateral soft tissue swelling. Suspected avulsion fracture injury off the lateral fibular malleolus. Ankle mortise is  symmetric. Ankle effusion. IMPRESSION: Lateral soft tissue swelling with suspected avulsion fractures type injury off the lateral fibular malleolus. Electronically Signed   By: Jasmine PangKim  Fujinaga M.D.   On: 10/07/2016 22:28   Dg Foot Complete Right  Result Date: 10/07/2016 CLINICAL DATA:  Right foot pain EXAM: RIGHT FOOT COMPLETE - 3+ VIEW COMPARISON:  None. FINDINGS: Dorsal osteophytes. No dislocation is evident. No definite acute displaced foot fracture is seen. Suspect avulsion fracture off the lateral fibular malleolus IMPRESSION: 1. No definite acute osseous abnormality at the foot 2. Probable avulsion fracture injury off the lateral fibular malleolus Electronically Signed   By: Jasmine PangKim  Fujinaga M.D.   On: 10/07/2016 22:27    Procedures Procedures (including critical care time)  Medications Ordered in ED Medications  ibuprofen (ADVIL,MOTRIN) tablet 800 mg (not administered)     Initial Impression / Assessment and Plan / ED Course  I have reviewed the triage vital signs and the nursing notes.  Pertinent labs & imaging results that were available during my care of the patient were reviewed by me and considered in my medical decision making (see chart for details).     Imaging reviewed. RICE, aso, crutches. Referral to Dr. Romeo AppleHarrison for f/u care.  Final Clinical Impressions(s) / ED Diagnoses   Final diagnoses:  Sprain of calcaneofibular ligament of right ankle, initial encounter  Avulsion fracture of distal end of fibula    New Prescriptions New Prescriptions   IBUPROFEN (ADVIL,MOTRIN) 800 MG TABLET    Take 1 tablet (800 mg total) by mouth 3 (three) times daily.     Burgess Amordol, Beauregard Jarrells, PA-C 10/07/16 2330    Rolland PorterJames, Mark, MD 10/19/16 [phone]

## 2016-10-07 NOTE — Discharge Instructions (Signed)
Wear the ASO and use crutches to avoid weight bearing.  Use ice and elevation as much as possible for the next several days to help reduce the swelling.  Take the medications prescribed.  Use the ibuprofen for inflammation and pain.  Call the orthopedic doctor listed for a recheck of your injury within 1 week.

## 2016-10-07 NOTE — ED Notes (Signed)
Pt refused crutches.

## 2016-10-07 NOTE — ED Triage Notes (Signed)
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

## 2016-10-07 NOTE — ED Notes (Signed)
ED Provider at bedside. 

## 2016-10-13 ENCOUNTER — Encounter: Payer: Self-pay | Admitting: Orthopaedic Surgery

## 2016-10-13 ENCOUNTER — Ambulatory Visit (INDEPENDENT_AMBULATORY_CARE_PROVIDER_SITE_OTHER): Payer: Self-pay | Admitting: Orthopaedic Surgery

## 2016-10-13 VITALS — BP 146/104 | HR 103 | Temp 98.6°F | Ht 72.0 in | Wt 280.0 lb

## 2016-10-13 DIAGNOSIS — S8264XA Nondisplaced fracture of lateral malleolus of right fibula, initial encounter for closed fracture: Secondary | ICD-10-CM

## 2016-10-13 NOTE — Patient Instructions (Signed)
Out of work next two weeks. 
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
## 2016-11-03 ENCOUNTER — Ambulatory Visit (INDEPENDENT_AMBULATORY_CARE_PROVIDER_SITE_OTHER): Payer: Self-pay

## 2016-11-03 ENCOUNTER — Encounter: Payer: Self-pay | Admitting: Orthopaedic Surgery

## 2016-11-03 ENCOUNTER — Ambulatory Visit (INDEPENDENT_AMBULATORY_CARE_PROVIDER_SITE_OTHER): Payer: Self-pay | Admitting: Orthopaedic Surgery

## 2016-11-03 DIAGNOSIS — S8264XD Nondisplaced fracture of lateral malleolus of right fibula, subsequent encounter for closed fracture with routine healing: Secondary | ICD-10-CM

## 2016-11-03 NOTE — Progress Notes (Signed)
CC:  My ankle is still tender  He has an ankle brace.  He has no new trauma.    He has tenderness of the right lateral malleolus.  X-rays were done, reported separately.  Encounter Diagnosis  Name Primary?  . Closed nondisplaced fracture of lateral malleolus of right fibula with routine healing, subsequent encounter Yes   Continue the ankle brace.  Stay out of work.  Return in two weeks.  X-rays on return.  Call if any problem.  Precautions discussed.   Electronically Signed Darreld Mclean, MD 8/21/20182:36 PM

## 2016-11-03 NOTE — Patient Instructions (Signed)
Out of work 

## 2016-11-18 ENCOUNTER — Ambulatory Visit (INDEPENDENT_AMBULATORY_CARE_PROVIDER_SITE_OTHER): Payer: Self-pay

## 2016-11-18 ENCOUNTER — Ambulatory Visit (INDEPENDENT_AMBULATORY_CARE_PROVIDER_SITE_OTHER): Payer: Self-pay | Admitting: Orthopaedic Surgery

## 2016-11-18 ENCOUNTER — Encounter: Payer: Self-pay | Admitting: Orthopaedic Surgery

## 2016-11-18 DIAGNOSIS — S8264XD Nondisplaced fracture of lateral malleolus of right fibula, subsequent encounter for closed fracture with routine healing: Secondary | ICD-10-CM

## 2016-11-18 NOTE — Progress Notes (Signed)
CC:  My ankle is better  He has much less pain of the right ankle.  He has some swelling at times.  He uses his brace.  NV intact. ROM is full.  Slight lateral tenderness of the right ankle.  X-rays were done and reported separately.  Encounter Diagnosis  Name Primary?  . Closed nondisplaced fracture of lateral malleolus of right fibula with routine healing, subsequent encounter Yes   He may return to work Monday.  Return in one month.  X-rays then.  Call if any problem.  Precautions discussed.   Electronically Signed Darreld McleanWayne Rossetta Kama, MD 9/5/20182:13 PM

## 2016-12-16 ENCOUNTER — Other Ambulatory Visit: Payer: Self-pay

## 2016-12-16 ENCOUNTER — Encounter: Payer: Self-pay | Admitting: Orthopaedic Surgery

## 2016-12-17 NOTE — Progress Notes (Signed)
This encounter was created in error - please disregard.

## 2018-12-28 LAB — HEMOGLOBIN A1C: Hemoglobin A1C: 5.7

## 2019-06-29 LAB — LIPID PANEL
Cholesterol: 205 — AB (ref 0–200)
HDL: 29 — AB (ref 35–70)
LDL Cholesterol: 132
Triglycerides: 287 — AB (ref 40–160)

## 2019-06-29 LAB — BASIC METABOLIC PANEL
BUN: 14 (ref 4–21)
Creatinine: 1 (ref 0.6–1.3)

## 2019-06-29 LAB — TESTOSTERONE: Testosterone: 140

## 2019-06-29 LAB — COMPREHENSIVE METABOLIC PANEL
GFR calc Af Amer: 111
GFR calc non Af Amer: 96

## 2020-02-02 ENCOUNTER — Encounter: Payer: Self-pay | Admitting: "Endocrinology

## 2020-02-02 ENCOUNTER — Other Ambulatory Visit: Payer: Self-pay

## 2020-02-02 ENCOUNTER — Ambulatory Visit (INDEPENDENT_AMBULATORY_CARE_PROVIDER_SITE_OTHER): Payer: PRIVATE HEALTH INSURANCE | Admitting: "Endocrinology

## 2020-02-02 VITALS — BP 136/92 | HR 84 | Ht 72.0 in | Wt 329.8 lb

## 2020-02-02 DIAGNOSIS — E291 Testicular hypofunction: Secondary | ICD-10-CM
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
## 2020-02-14 ENCOUNTER — Telehealth: Payer: Self-pay

## 2020-02-14 NOTE — Telephone Encounter (Signed)
Called pre cert dept at [employer], had to leave a voicemail on [employer], they will call back with in 1 business day.

## 2020-02-14 NOTE — Telephone Encounter (Signed)
Spoke with Lurena Joiner from insurance company @ [phone]. Requested office notes to be faxed to [phone], fax submitted, awaiting response.

## 2020-02-14 NOTE — Telephone Encounter (Signed)
Brandy with [facility] called to see if you were getting a authorization for his MRI on 12/3. She said she does not see anything on file. She is asking for a call back at [phone] ext [phone]

## 2020-02-14 NOTE — Telephone Encounter (Signed)
AUTH # [id number]

## 2020-02-16 ENCOUNTER — Ambulatory Visit (HOSPITAL_COMMUNITY)
Admission: RE | Admit: 2020-02-16 | Discharge: 2020-02-16 | Disposition: A | Discharge status: Home / Self Care | Payer: PRIVATE HEALTH INSURANCE | Source: Ambulatory Visit | Attending: "Endocrinology | Admitting: "Endocrinology

## 2020-02-16 ENCOUNTER — Other Ambulatory Visit: Payer: Self-pay

## 2020-02-16 DIAGNOSIS — E291 Testicular hypofunction: Secondary | ICD-10-CM | POA: Insufficient documentation

## 2020-02-16 MED ORDER — GADOBUTROL 1 MMOL/ML IV SOLN
7.0000 mL | Freq: Once | INTRAVENOUS | Status: AC | PRN
  Administered 2020-02-16: 7 mL via INTRAVENOUS

## 2020-02-26 ENCOUNTER — Ambulatory Visit (INDEPENDENT_AMBULATORY_CARE_PROVIDER_SITE_OTHER): Payer: PRIVATE HEALTH INSURANCE | Admitting: "Endocrinology

## 2020-02-26 ENCOUNTER — Other Ambulatory Visit: Payer: Self-pay

## 2020-02-26 ENCOUNTER — Encounter: Payer: Self-pay | Admitting: "Endocrinology

## 2020-02-26 VITALS — BP 130/88 | HR 92 | Ht 72.0 in | Wt 338.0 lb

## 2020-02-26 DIAGNOSIS — E782 Mixed hyperlipidemia: Secondary | ICD-10-CM | POA: Diagnosis not present

## 2020-02-26 DIAGNOSIS — E291 Testicular hypofunction: Secondary | ICD-10-CM | POA: Diagnosis not present

## 2020-02-26 DIAGNOSIS — I10 Essential (primary) hypertension: Secondary | ICD-10-CM | POA: Diagnosis not present

## 2020-02-26 DIAGNOSIS — E236 Other disorders of pituitary gland: Secondary | ICD-10-CM | POA: Diagnosis not present
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
## 2020-03-06 ENCOUNTER — Encounter: Payer: Self-pay | Admitting: "Endocrinology

## 2020-03-06 ENCOUNTER — Telehealth (INDEPENDENT_AMBULATORY_CARE_PROVIDER_SITE_OTHER): Payer: PRIVATE HEALTH INSURANCE | Admitting: "Endocrinology

## 2020-03-06 DIAGNOSIS — E291 Testicular hypofunction: Secondary | ICD-10-CM | POA: Diagnosis not present

## 2020-03-06 NOTE — Progress Notes (Signed)
03/06/2020                                 Endocrinology Telehealth Visit Follow up Note -During COVID -19 Pandemic  This visit type was conducted  via telephone due to national recommendations for restrictions regarding the COVID-19 Pandemic  in an effort to limit this patient's exposure and mitigate transmission of the corona virus.   I connected with Brandon Day on 03/06/2020   by telephone and verified that I am speaking with the correct person using two identifiers. Brandon Day, 1985-09-17. he has verbally consented to this visit.  I was in my office and patient was in his residence. No other persons were with me during the encounter. All issues noted in this document were discussed and addressed. The format was not optimal for physical exam.   Brandon Day, 34 y.o., male  Chief complaint: Follow-up for hypogonadism   Past Medical History:  Diagnosis Date  . Essential hypertension   . Gout   . Hyperlipidemia   . Hypogonadism in male   . Localized pain of left shoulder joint   . Obstructive sleep apnea   . Prediabetes   . Vitamin D deficiency    Past Surgical History:  Procedure Laterality Date  . FEMUR FRACTURE SURGERY    . FOOT SURGERY Right 2011  . FRACTURE SURGERY    . HEMATOMA EVACUATION Left 07/06/2012   Procedure: EVACUATION HEMATOMA;  Surgeon: Vickki Hearing, MD;  Location: [facility];  Service: Orthopedics;  Laterality: Left;  left thigh   . WISDOM TOOTH EXTRACTION N/A    all 4   wisdom teeth extracted  . WRIST FRACTURE SURGERY Left 1999   Social History   Socioeconomic History  . Marital status: Married    Spouse name: Not on file  . Number of children: Not on file  . Years of education: Not on file  . Highest education level: Not on file  Occupational History  . Not on file  Tobacco Use  . Smoking status: Former Games developer  . Smokeless tobacco: Current User  Vaping Use  . Vaping Use: Never used  Substance and  Sexual Activity  . Alcohol use: Yes    Comment: rearely,  beer or liquor  . Drug use: No  . Sexual activity: Not on file  Other Topics Concern  . Not on file  Social History Narrative  . Not on file   Social Determinants of Health   Financial Resource Strain: Not on file  Food Insecurity: Not on file  Transportation Needs: Not on file  Physical Activity: Not on file  Stress: Not on file  Social Connections: Not on file   Outpatient Encounter Medications as of 03/06/2020  Medication Sig  . Cholecalciferol (VITAMIN D-3) 125 MCG (5000 UT) TABS Take 1 tablet by mouth daily.  Marland Kitchen losartan-hydrochlorothiazide (HYZAAR) 100-25 MG tablet Take 1 tablet by mouth daily.   No facility-administered encounter medications on file as of 03/06/2020.   ALLERGIES: Allergies  Allergen Reactions  . Benadryl [Diphenhydramine Hcl] Hives    VACCINATION STATUS:  There is no immunization history on file for this patient.  HPI: Brandon Day is a 34 y.o.-year-old man returning for follow-up with repeat labs and MRI brain/pituitary/sella.  He was seen in consultation for hypogonadism. Original consult was requested by by his   provider  Drema Halon, FNP.  He was found to have hypogonadism with  total testostetone on 3 separate occasions since January 2020.   -His levels ranged between 140-201.  He did have low FSH of 1.4 and inappropriately normal LH of 2.3 on March 22, 2018 when he had low total testosterone of 201. See notes from previous visit. 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   ROS: Limited as above.  PE: There were no vitals taken for this visit. Wt Readings from Last 3 Encounters:  02/26/20 (!) 338 lb (153.3 kg)  02/02/20 (!) 329 lb 12.8 oz (149.6 kg)  10/13/16 280 lb (127 kg)   Constitutional:  There is no height or weight on file to calculate BMI.,  not in acute distress, + Normal State of Mind  Neurological: no tremor  with outstretched hands, DTR normal in all 4 Genital exam: normal male escutcheon, no inguinal LAD, normal phallus, testes ~25 mL, no testicular masses, no penile discharge.  No gynecomastia.   CMP ( most recent) CMP     Component Value Date/Time   NA 141 09/06/2012 1809   K 5.0 09/06/2012 1809   CL 104 09/06/2012 1809   CO2 27 07/06/2012 1010   GLUCOSE 86 09/06/2012 1809   BUN 14 06/29/2019 0000   CREATININE 1.0 06/29/2019 0000   CREATININE 0.90 09/06/2012 1809   CALCIUM 9.1 07/06/2012 1010   PROT 6.3 02/17/2010 0650   ALBUMIN 2.4 (L) 02/17/2010 0650   AST 76 (H) 02/17/2010 0650   ALT 116 (H) 02/17/2010 0650   ALKPHOS 179 (H) 02/17/2010 0650   BILITOT 0.7 02/17/2010 0650   GFRNONAA 96 06/29/2019 0000   GFRAA 111 06/29/2019 0000     Diabetic Labs (most recent): Lab Results  Component Value Date   HGBA1C 5.7 12/28/2018     Lipid Panel ( most recent) Lipid Panel     Component Value Date/Time   CHOL 205 (A) 06/29/2019 0000   TRIG 287 (A) 06/29/2019 0000   HDL 29 (A) 06/29/2019 0000   LDLCALC 132 06/29/2019 0000     Total testosterone in April 2021 140, September 02 2184, April 04 2199  January 2020 labs included [facility] inappropriately normal at 2.3, [facility] low at 1.4   MRI brain with and without contrast on February 16, 2020 IMPRESSION: 1. Small area of encephalomalacia in the posterior aspect of the right superior frontal gyrus. 2. Few scattered foci of T2 hyperintensity within the white matter the cerebral hemispheres, nonspecific. Differential considerations include chronic microvascular ischemic changes, migraine headache, demyelinating disease and other infectious/inflammatory processes. 3. 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  His most recent lab work:  Prolactin 7.0-normal, LH 2.8-normal, LH 2.6-normal, total testosterone 198, free testosterone  43.8, SHBG 15  ASSESSMENT: 1.  Hypogonadotropic hypogonadism  2.  Obesity 3.  Pre-Diabetes 4.  Hyperlipidemia 5.  Hypertension  PLAN:  I reviewed his recent lab work with him.  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  - I have discussed potential causes of hypogonadism, diagnosis of hypogonadism,  and relevant workup to confirm the diagnosis of hypogonadism before initiating testosterone replacement therapy.  His previsit labs show low testosterone of 198 for his age.  Previous measurements were between 140-201. - In this particular patient with no biological children who  Plans to have  children, testosterone replacement should be delayed to give him a chance for fertility.  His current total testosterone of around 200 ng per DL,  is adequate for reproduction purposes.  -Reportedly, he has worked with fertility providers in the past, and planning to pursue same consult in the next several weeks.  -In light of his prediabetes, hypertension, hyperlipidemia, and obesity he will benefit the most from weight loss.  We briefly discussed about controlling his diet, improving his sleep, and exercise regularly.   He declined an offer to treat hyperlipidemia.  He is advised to discuss it with his PCP.  In light of his MRI findings for possible microvascular lesions, encephalomalacia he would benefit from all risk control including hypertension, diabetes, hyperlipidemia.  -No prescription was initiated today.  He is encouraged to continue follow-up with his PCP closely.     - Time spent on this patient care encounter:  20 minutes of which 50% was spent in  counseling and the rest reviewing  his current and  previous labs / studies and medications  doses and developing a plan for long term care. Reshaun Blickenstaff  participated in the discussions, expressed understanding, and voiced agreement with the  above plans.  All questions were answered to his satisfaction. he is encouraged to contact clinic should he have any questions or concerns prior to his return visit.   Return in about 6 months (around 09/04/2020) for F/U with Pre-visit Labs, Fasting Labs  in AM B4 8.  Marquis Lunch, MD [facility] [address] [place], [place] [address] Phone: [phone]  Fax: [phone]   03/06/2020, 1:09 PM  This note was partially dictated with voice recognition software. Similar sounding words can be transcribed inadequately or may not  be corrected upon review.

## 2020-09-05 ENCOUNTER — Ambulatory Visit: Payer: PRIVATE HEALTH INSURANCE | Admitting: "Endocrinology

## 2021-06-20 IMAGING — MR MR HEAD WO/W CM
19 of 21 series · 32 of 48 positions shown · IV contrast (gadavist)
Comparison: None.

CLINICAL DATA: Traumatic brain injury. Neuro decline.
Hypogonadotropic hypogonadism.

EXAM:
MRI HEAD WITHOUT AND WITH CONTRAST
TECHNIQUE: Multiplanar, multiecho pulse sequences of the brain and surrounding
structures were obtained without and with intravenous contrast.
CONTRAST:  7mL GADAVIST GADOBUTROL 1 MMOL/ML IV SOLN

[Series 5: DWI · axial · 3.0mm · 0.77mm/px · z∈[-43,+98]mm · 2 of 44 slices shown (1 of 3)]
[im 1/44]
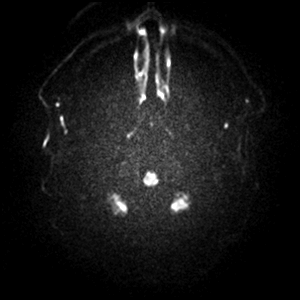
[im 44/44]
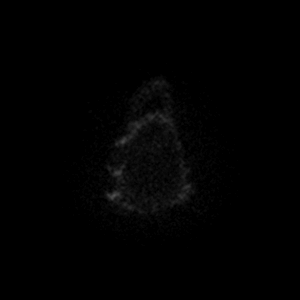

[Series 5: DWI · axial · 3.0mm · 0.77mm/px · z∈[-43,+98]mm · 2 of 44 slices shown (2 of 3)]
[im 1/44]
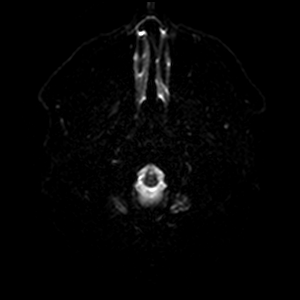
[im 44/44]
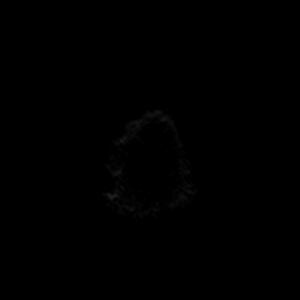

[Series 6: DWI · axial · 3.0mm · 0.77mm/px · z∈[-43,+98]mm · 2 of 44 slices shown (3 of 3)]
[im 1/44]
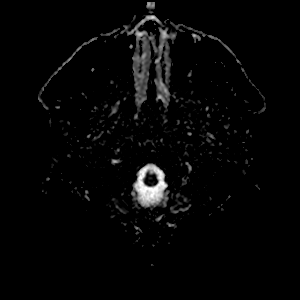
[im 44/44]
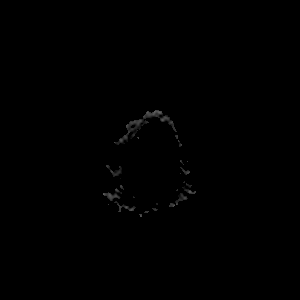

[Series 7: T1 · sagittal · 5.0mm · 0.75mm/px · 1 of 20 slices shown (1 of 3)]
[im 1/20]
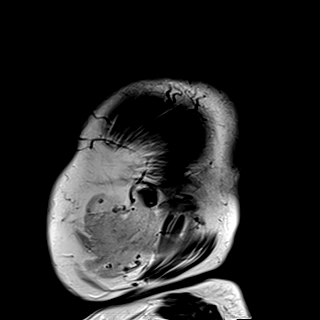

[Series 8: T2 · axial · 5.0mm · 0.72mm/px · 1 of 21 slices shown]
[im 1/21]
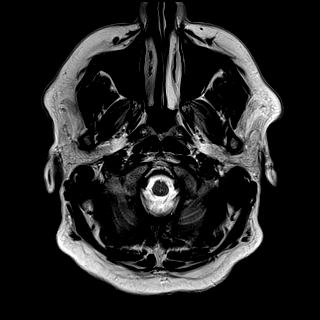

[Series 9: mag_images · axial · 3.0mm · 0.90mm/px · z∈[-60,+116]mm · 3 of 60 slices shown]
[im 1/60]
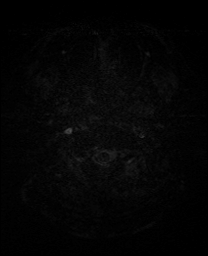
[im 30/60]
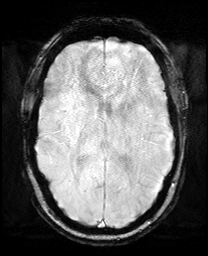
[im 60/60]
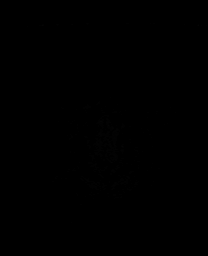

[Series 10: pha_images · axial · 3.0mm · 0.90mm/px · z∈[-57,+110]mm · 3 of 57 slices shown]
[im 1/57]
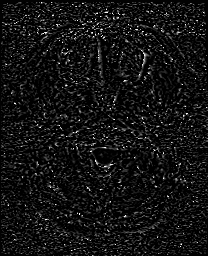
[im 29/57]
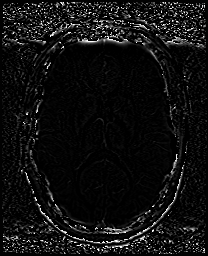
[im 57/57]
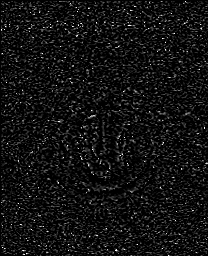

[Series 11: swi_images · axial · 3.0mm · 0.90mm/px · z∈[-60,+116]mm · 3 of 60 slices shown]
[im 1/60]
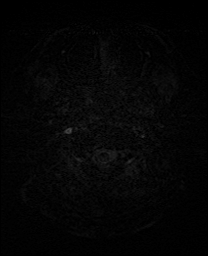
[im 30/60]
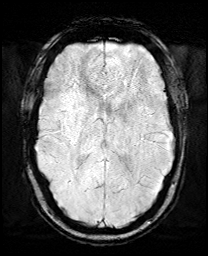
[im 60/60]
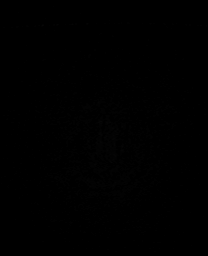

[Series 12: mip_images(sw) · axial · 24.0mm · 0.90mm/px · z∈[-50,+105]mm · 3 of 53 slices shown]
[im 1/53]
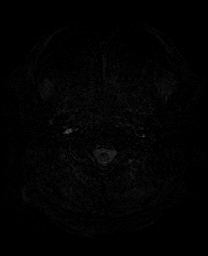
[im 27/53]
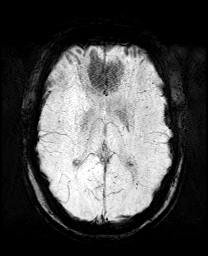
[im 53/53]
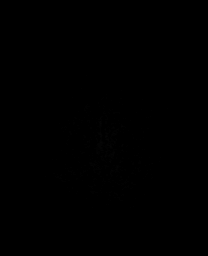

[Series 13: FLAIR · axial · 3.0mm · 0.45mm/px · z∈[-41,+99]mm · 2 of 40 slices shown]
[im 1/40]
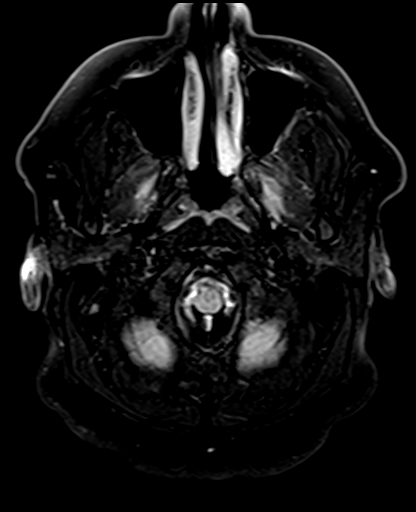
[im 40/40]
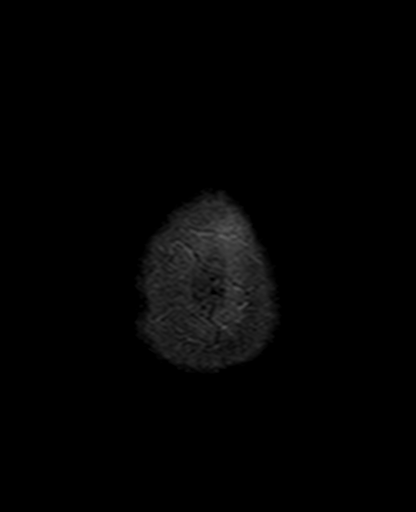

[Series 15: T1 · sagittal · 3.0mm · 0.25mm/px · 1 of 12 slices shown (2 of 3)]
[im 1/12]
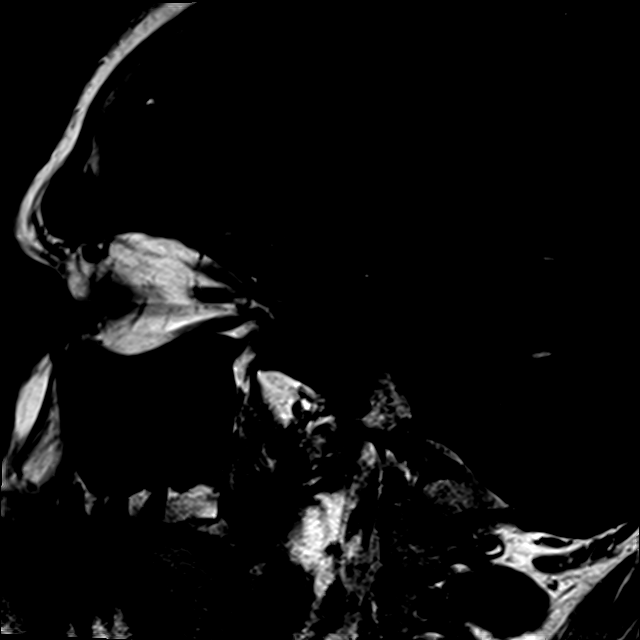

[Series 16: T1 · coronal · 3.0mm · 0.25mm/px · 1 of 13 slices shown (3 of 3)]
[im 1/13]
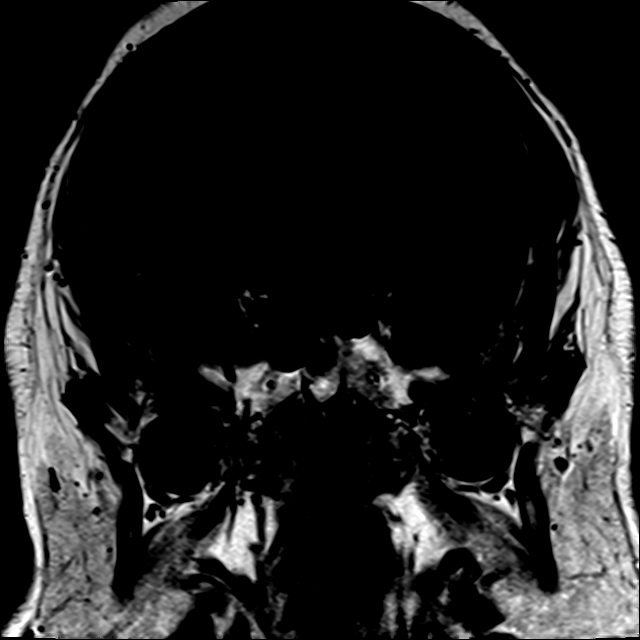

[Series 17: t1_tse_cor_dynamic pre · coronal · non-contrast · 3.0mm · 0.49mm/px · 1 of 5 slices shown]
[im 1/5]
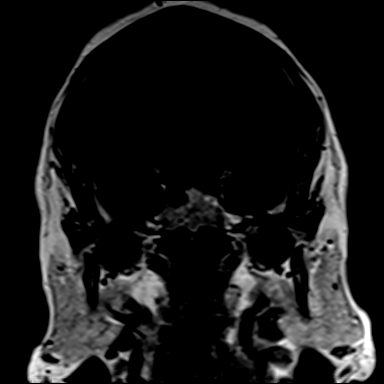

[Series 18: t1_tse_cor_dynamic post · coronal · 3.0mm · 0.49mm/px · 2 of 30 slices shown]
[im 1/30]
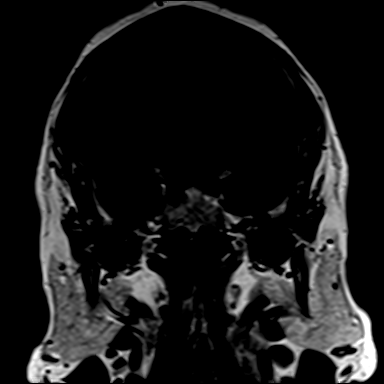
[im 30/30]
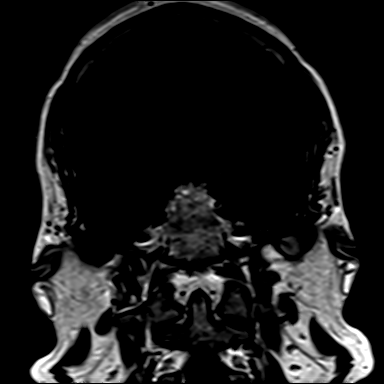

[Series 19: t1_tse_cor_dynamic post_sub · coronal · 3.0mm · 0.49mm/px · 1 of 25 slices shown]
[im 1/25]
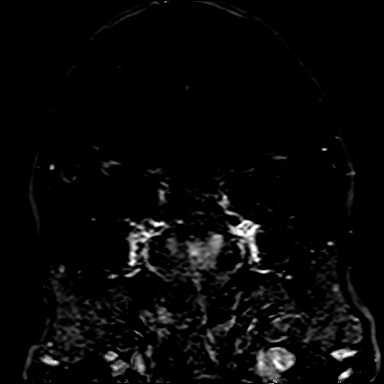

[Series 20: T1 post-contrast · sagittal · 3.0mm · 0.25mm/px · 1 of 12 slices shown (1 of 3)]
[im 1/12]
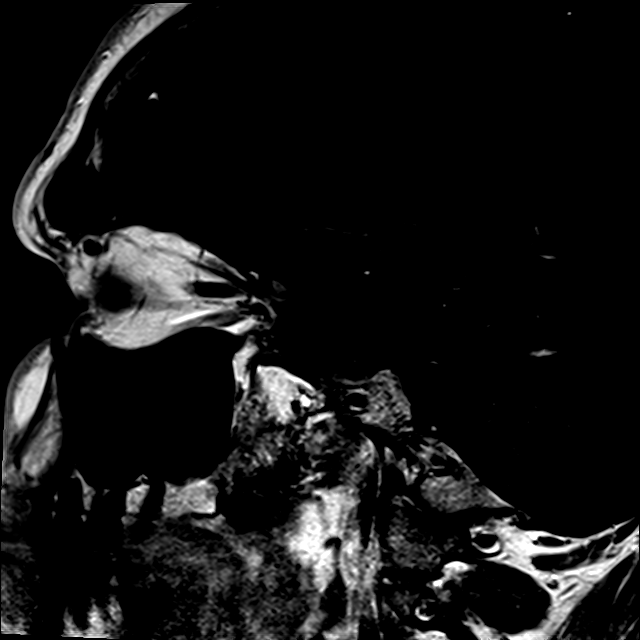

[Series 21: T1 post-contrast · coronal · 3.0mm · 0.25mm/px · 1 of 13 slices shown (2 of 3)]
[im 1/13]
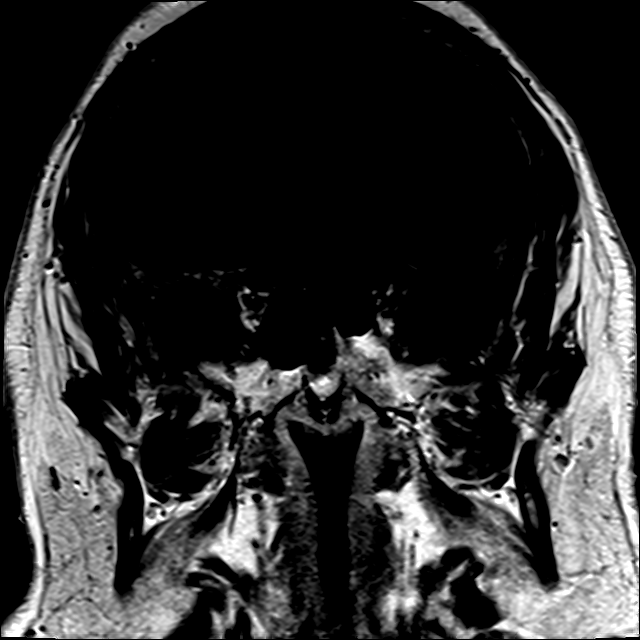

[Series 22: T2 post-contrast · coronal · 5.0mm · 0.72mm/px · 1 of 28 slices shown]
[im 1/28]
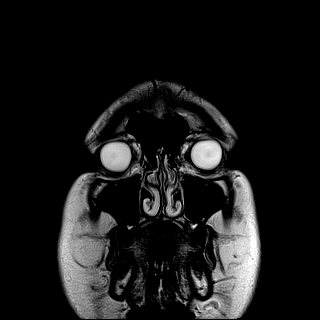

[Series 24: T1 post-contrast · coronal · 5.0mm · 0.34mm/px · 1 of 28 slices shown (3 of 3)]
[im 1/28]
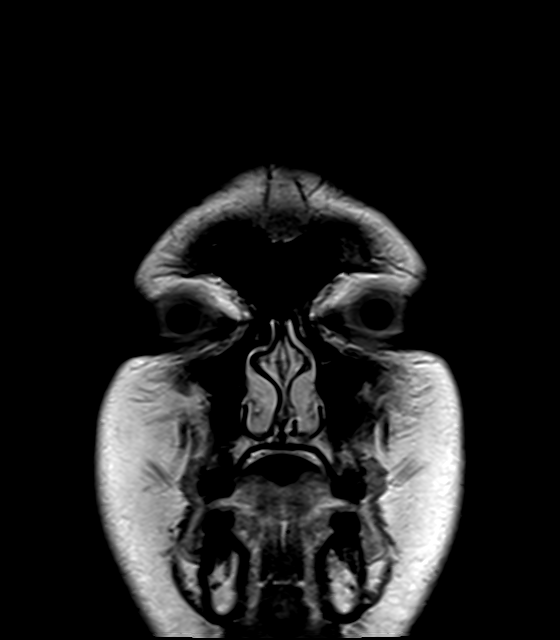

[32 of 48 positions shown; findings below may reference images not displayed]

FINDINGS: Brain: No acute infarction, hemorrhage, hydrocephalus, extra-axial
collection or mass lesion.

Small area of encephalomalacia in the posterior aspect of the right
superior frontal gyrus (series 13, image 35). Few scattered foci of
T2 hyperintensity are seen within the white matter the cerebral
hemispheres.

Pituitary/Sella: The pituitary has normal size. A 6 mm nonenhancing
focus within the pituitary gland just posterior to the level of the
pituitary stalk. Remainder of the pituitary gland has normal
homogeneous contrast enhancement. The infundibulum is midline. The
hypothalamus and mamillary bodies are normal. There is no mass
effect on the optic chiasm or optic nerves. The infundibular and
chiasmatic recesses are clear. Normal cavernous sinus and cavernous
internal carotid artery flow voids.

Vascular: Normal flow voids.

Skull and upper cervical spine: Normal marrow signal.

Sinuses/Orbits: Negative.

Other: None.
IMPRESSION: 1. Small area of encephalomalacia in the posterior aspect of the
right superior frontal gyrus.
2. Few scattered foci of T2 hyperintensity within the white matter
the cerebral hemispheres, nonspecific. Differential considerations
include chronic microvascular ischemic changes, migraine headache,
demyelinating disease and other infectious/inflammatory processes.
3. A 6 mm nonenhancing focus within the pituitary gland just
posterior to the level of the pituitary stalk likely representing Klever
Srxy cleft pouch cyst.
